# Patient Record
Sex: Male | Born: 1960 | Race: White | Hispanic: No | Marital: Married | Smoking: Current every day smoker
Health system: Southern US, Community
[De-identification: ages and names within clinical notes are randomized; demographics above are authoritative.]

## PROBLEM LIST (undated history)

## (undated) DIAGNOSIS — R519 Headache, unspecified: Secondary | ICD-10-CM

## (undated) DIAGNOSIS — J189 Pneumonia, unspecified organism: Secondary | ICD-10-CM

## (undated) DIAGNOSIS — I2510 Atherosclerotic heart disease of native coronary artery without angina pectoris: Secondary | ICD-10-CM

## (undated) DIAGNOSIS — M1990 Unspecified osteoarthritis, unspecified site: Secondary | ICD-10-CM

## (undated) DIAGNOSIS — R51 Headache: Secondary | ICD-10-CM

## (undated) DIAGNOSIS — I10 Essential (primary) hypertension: Secondary | ICD-10-CM

## (undated) DIAGNOSIS — E785 Hyperlipidemia, unspecified: Secondary | ICD-10-CM

## (undated) DIAGNOSIS — I5189 Other ill-defined heart diseases: Secondary | ICD-10-CM

## (undated) HISTORY — PX: TONSILLECTOMY: SUR1361

## (undated) HISTORY — PX: OTHER SURGICAL HISTORY: SHX169

## (undated) HISTORY — PX: REPLACEMENT TOTAL KNEE: SUR1224

## (undated) HISTORY — DX: Hyperlipidemia, unspecified: E78.5

## (undated) HISTORY — DX: Other ill-defined heart diseases: I51.89

## (undated) HISTORY — DX: Atherosclerotic heart disease of native coronary artery without angina pectoris: I25.10

---

## 2007-05-13 ENCOUNTER — Emergency Department: Payer: Self-pay | Admitting: Emergency Medicine

## 2009-04-22 ENCOUNTER — Ambulatory Visit (HOSPITAL_COMMUNITY): Admission: RE | Admit: 2009-04-22 | Discharge: 2009-04-22 | Payer: Self-pay | Admitting: Gastroenterology

## 2011-01-03 ENCOUNTER — Emergency Department: Payer: Self-pay | Admitting: *Deleted

## 2013-03-06 DIAGNOSIS — J189 Pneumonia, unspecified organism: Secondary | ICD-10-CM

## 2013-03-06 HISTORY — DX: Pneumonia, unspecified organism: J18.9

## 2015-08-26 ENCOUNTER — Other Ambulatory Visit: Payer: Self-pay | Admitting: Gastroenterology

## 2015-10-08 ENCOUNTER — Encounter (HOSPITAL_COMMUNITY): Payer: Self-pay | Admitting: *Deleted

## 2015-10-11 NOTE — Anesthesia Preprocedure Evaluation (Addendum)
Anesthesia Evaluation  Patient identified by MRN, date of birth, ID band Patient awake    Reviewed: Allergy & Precautions, NPO status , Patient's Chart, lab work & pertinent test results  History of Anesthesia Complications Negative for: history of anesthetic complications  Airway Mallampati: II  TM Distance: >3 FB Neck ROM: Full    Dental  (+) Teeth Intact, Dental Advisory Given   Pulmonary neg shortness of breath, neg sleep apnea, neg COPD, neg recent URI, Current Smoker,    Pulmonary exam normal breath sounds clear to auscultation       Cardiovascular Exercise Tolerance: Good hypertension, Pt. on medications (-) angina(-) Past MI, (-) Cardiac Stents and (-) CABG (-) dysrhythmias  Rhythm:Regular Rate:Normal     Neuro/Psych  Headaches (cluster headaches),    GI/Hepatic Neg liver ROS, Bowel prep,neg GERD  ,  Endo/Other  negative endocrine ROS  Renal/GU negative Renal ROS     Musculoskeletal  (+) Arthritis ,   Abdominal   Peds  Hematology negative hematology ROS (+)   Anesthesia Other Findings   Reproductive/Obstetrics                            Anesthesia Physical Anesthesia Plan  ASA: II  Anesthesia Plan: MAC   Post-op Pain Management:    Induction: Intravenous  Airway Management Planned: Natural Airway and Nasal Cannula  Additional Equipment:   Intra-op Plan:   Post-operative Plan:   Informed Consent: I have reviewed the patients History and Physical, chart, labs and discussed the procedure including the risks, benefits and alternatives for the proposed anesthesia with the patient or authorized representative who has indicated his/her understanding and acceptance.   Dental advisory given  Plan Discussed with: CRNA  Anesthesia Plan Comments:        Anesthesia Quick Evaluation

## 2015-10-12 ENCOUNTER — Ambulatory Visit (HOSPITAL_COMMUNITY)
Admission: RE | Admit: 2015-10-12 | Discharge: 2015-10-12 | Disposition: A | Discharge status: Home / Self Care | Payer: No Typology Code available for payment source | Source: Ambulatory Visit | Attending: Gastroenterology | Admitting: Gastroenterology

## 2015-10-12 ENCOUNTER — Ambulatory Visit (HOSPITAL_COMMUNITY): Payer: No Typology Code available for payment source | Admitting: Anesthesiology

## 2015-10-12 ENCOUNTER — Encounter (HOSPITAL_COMMUNITY)
Admission: RE | Disposition: A | Discharge status: Home / Self Care | Payer: Self-pay | Source: Ambulatory Visit | Attending: Gastroenterology

## 2015-10-12 ENCOUNTER — Encounter (HOSPITAL_COMMUNITY): Payer: Self-pay | Admitting: *Deleted

## 2015-10-12 DIAGNOSIS — D120 Benign neoplasm of cecum: Secondary | ICD-10-CM | POA: Insufficient documentation

## 2015-10-12 DIAGNOSIS — Z1211 Encounter for screening for malignant neoplasm of colon: Secondary | ICD-10-CM | POA: Diagnosis present

## 2015-10-12 DIAGNOSIS — I10 Essential (primary) hypertension: Secondary | ICD-10-CM | POA: Diagnosis not present

## 2015-10-12 DIAGNOSIS — F17200 Nicotine dependence, unspecified, uncomplicated: Secondary | ICD-10-CM | POA: Insufficient documentation

## 2015-10-12 DIAGNOSIS — Z791 Long term (current) use of non-steroidal anti-inflammatories (NSAID): Secondary | ICD-10-CM | POA: Diagnosis not present

## 2015-10-12 DIAGNOSIS — Z86010 Personal history of colonic polyps: Secondary | ICD-10-CM | POA: Insufficient documentation

## 2015-10-12 DIAGNOSIS — D122 Benign neoplasm of ascending colon: Secondary | ICD-10-CM | POA: Insufficient documentation

## 2015-10-12 DIAGNOSIS — K621 Rectal polyp: Secondary | ICD-10-CM | POA: Insufficient documentation

## 2015-10-12 DIAGNOSIS — Z79899 Other long term (current) drug therapy: Secondary | ICD-10-CM | POA: Diagnosis not present

## 2015-10-12 HISTORY — DX: Headache, unspecified: R51.9

## 2015-10-12 HISTORY — DX: Essential (primary) hypertension: I10

## 2015-10-12 HISTORY — DX: Pneumonia, unspecified organism: J18.9

## 2015-10-12 HISTORY — PX: COLONOSCOPY WITH PROPOFOL: SHX5780

## 2015-10-12 HISTORY — DX: Unspecified osteoarthritis, unspecified site: M19.90

## 2015-10-12 HISTORY — DX: Headache: R51

## 2015-10-12 SURGERY — COLONOSCOPY WITH PROPOFOL
Anesthesia: Monitor Anesthesia Care

## 2015-10-12 MED ORDER — ONDANSETRON HCL 4 MG/2ML IJ SOLN
INTRAMUSCULAR | Status: DC | PRN
  Administered 2015-10-12: 4 mg via INTRAVENOUS

## 2015-10-12 MED ORDER — PROPOFOL 500 MG/50ML IV EMUL
INTRAVENOUS | Status: DC | PRN
  Administered 2015-10-12: 100 ug/kg/min via INTRAVENOUS

## 2015-10-12 MED ORDER — LIDOCAINE HCL (CARDIAC) 20 MG/ML IV SOLN
INTRAVENOUS | Status: DC | PRN
  Administered 2015-10-12: 100 mg via INTRAVENOUS

## 2015-10-12 MED ORDER — LACTATED RINGERS IV SOLN
INTRAVENOUS | Status: DC
  Administered 2015-10-12: 1000 mL via INTRAVENOUS

## 2015-10-12 MED ORDER — SODIUM CHLORIDE 0.9 % IV SOLN: INTRAVENOUS | Status: DC

## 2015-10-12 MED ORDER — ONDANSETRON HCL 4 MG/2ML IJ SOLN
INTRAMUSCULAR | Status: AC
  Filled 2015-10-12: qty 2

## 2015-10-12 MED ORDER — PROPOFOL 10 MG/ML IV BOLUS
INTRAVENOUS | Status: AC
  Filled 2015-10-12: qty 60

## 2015-10-12 MED ORDER — LIDOCAINE HCL (CARDIAC) 20 MG/ML IV SOLN
INTRAVENOUS | Status: AC
  Filled 2015-10-12: qty 5

## 2015-10-12 MED ORDER — PROPOFOL 10 MG/ML IV BOLUS
INTRAVENOUS | Status: DC | PRN
  Administered 2015-10-12: 20 mg via INTRAVENOUS
  Administered 2015-10-12 (×2): 40 mg via INTRAVENOUS
  Administered 2015-10-12: 20 mg via INTRAVENOUS

## 2015-10-12 SURGICAL SUPPLY — 21 items

## 2015-10-12 NOTE — Anesthesia Postprocedure Evaluation (Signed)
Anesthesia Post Note  Patient: Gerald Garza  Procedure(s) Performed: Procedure(s) (LRB): COLONOSCOPY WITH PROPOFOL (N/A)  Patient location during evaluation: PACU Anesthesia Type: MAC Level of consciousness: awake and alert Pain management: pain level controlled Vital Signs Assessment: post-procedure vital signs reviewed and stable Respiratory status: spontaneous breathing, nonlabored ventilation and respiratory function stable Cardiovascular status: stable and blood pressure returned to baseline Anesthetic complications: no    Last Vitals:  Vitals:   10/12/15 0907 10/12/15 0920  BP:    Pulse: 82   Resp: 17 20  Temp:      Last Pain:  Vitals:   10/12/15 0907  TempSrc: Oral                 Nilda Simmer

## 2015-10-12 NOTE — Discharge Instructions (Signed)

## 2015-10-12 NOTE — H&P (Signed)
  Procedure: Surveillance colonoscopy. 04/22/2009 colonoscopy with removal of three adenomatous and hyperplastic colon polyps was performed.  History: The patient is a 55 year old male born October 23, 1960. He is scheduled to undergo a surveillance colonoscopy today.  Past medical history: Tonsillectomy. Foot surgery. Hypertension. Lumbosacral degenerative disc disease. Ankylosing spondylitis. Cluster headaches. Hypercholesterolemia.  Medication allergies: Codeine causes nausea and itching  Exam: The patient is lying comfortably on the endoscopy stretcher. Abdomen is soft and nontender to palpation. Cardiac exam reveals a regular rhythm. Lungs are clear to auscultation.  Plan: Proceed with surveillance colonoscopy

## 2015-10-12 NOTE — Op Note (Signed)
[facility] Patient Name: Gerald Garza Procedure Date: 10/12/2015 MRN: [id number] Attending MD: Garlan Fair , MD Date of Birth: 1960-10-15 CSN: [id number] Age: 55 Admit Type: Outpatient Procedure:                Colonoscopy Indications:              High risk colon cancer surveillance: Personal                            history of non-advanced adenoma Providers:                Garlan Fair, MD, Laverta Baltimore RN, RN,                            Despina Pole Tech, Technician, Adair Laundry, CRNA Referring MD:              Medicines:                Propofol per Anesthesia Complications:            No immediate complications. Estimated Blood Loss:     Estimated blood loss: none. Procedure:                Pre-Anesthesia Assessment:                           - Prior to the procedure, a History and Physical                            was performed, and patient medications and                            allergies were reviewed. The patient's tolerance of                            previous anesthesia was also reviewed. The risks                            and benefits of the procedure and the sedation                            options and risks were discussed with the patient.                            All questions were answered, and informed consent                            was obtained. Prior Anticoagulants: The patient has                            taken no previous anticoagulant or antiplatelet                            agents. ASA Grade Assessment: II - A patient with  mild systemic disease. After reviewing the risks                            and benefits, the patient was deemed in                            satisfactory condition to undergo the procedure.                           After obtaining informed consent, the colonoscope                            was passed under direct vision. Throughout the       procedure, the patient's blood pressure, pulse, and                            oxygen saturations were monitored continuously. The                            [id number]) scope was introduced through                            the anus and advanced to the the cecum, identified                            by appendiceal orifice and ileocecal valve. The                            colonoscopy was performed without difficulty. The                            patient tolerated the procedure well. The quality                            of the bowel preparation was good. The ileocecal                            valve, the appendiceal orifice and the rectum were                            photographed. Scope In: 8:30:09 AM Scope Out: 8:58:42 AM Scope Withdrawal Time: 0 hours 23 minutes 11 seconds  Total Procedure Duration: 0 hours 28 minutes 33 seconds  Findings:      The perianal and digital rectal examinations were normal.      Two sessile polyps were found in the rectum. The polyps were 5 mm in       size. These polyps were removed with a cold snare. Resection and       retrieval were complete.      A 5 mm polyp was found in the ascending colon. The polyp was sessile.       The polyp was removed with a cold snare. Resection and retrieval were       complete.      A 3 mm polyp was found in the cecum. The  polyp was sessile. The polyp       was removed with a cold biopsy forceps. Resection and retrieval were       complete.      The exam was otherwise without abnormality. Impression:               - Two 5 mm polyps in the rectum, removed with a                            cold snare. Resected and retrieved.                           - One 5 mm polyp in the ascending colon, removed                            with a cold snare. Resected and retrieved.                           - One 3 mm polyp in the cecum, removed with a cold                            biopsy forceps. Resected and  retrieved.                           - The examination was otherwise normal. Moderate Sedation:      N/A- Per Anesthesia Care Recommendation:           - Patient has a contact number available for                            emergencies. The signs and symptoms of potential                            delayed complications were discussed with the                            patient. Return to normal activities tomorrow.                            Written discharge instructions were provided to the                            patient.                           - Repeat colonoscopy date to be determined after                            pending pathology results are reviewed for                            surveillance.                           - Resume previous diet.                           -  Continue present medications. Procedure Code(s):        --- Professional ---                           [phone], Colonoscopy, flexible; with removal of                            tumor(s), polyp(s), or other lesion(s) by snare                            technique                           45380, 58, Colonoscopy, flexible; with biopsy,                            single or multiple Diagnosis Code(s):        --- Professional ---                           Z86.010, Personal history of colonic polyps                           K62.1, Rectal polyp                           D12.2, Benign neoplasm of ascending colon                           D12.0, Benign neoplasm of cecum CPT copyright 2016 [employer]. All rights reserved. The codes documented in this report are preliminary and upon coder review may  be revised to meet current compliance requirements. Earle Gell, MD Garlan Fair, MD 10/12/2015 9:09:10 AM This report has been signed electronically. Number of Addenda: 0

## 2015-10-12 NOTE — Transfer of Care (Signed)
Immediate Anesthesia Transfer of Care Note  Patient: Gerald Garza  Procedure(s) Performed: Procedure(s): COLONOSCOPY WITH PROPOFOL (N/A)  Patient Location: ENDO  Anesthesia Type:MAC  Level of Consciousness:  sedated, patient cooperative and responds to stimulation  Airway & Oxygen Therapy:Patient Spontanous Breathing and Patient connected to face mask oxgen  Post-op Assessment:  Report given to ENDO RN and Post -op Vital signs reviewed and stable  Post vital signs:  Reviewed and stable  Last Vitals:  Vitals:   10/12/15 0755  BP: (!) 143/91  Pulse: 73  Resp: 12  Temp: 123XX123 C    Complications: No apparent anesthesia complications

## 2015-10-13 ENCOUNTER — Encounter (HOSPITAL_COMMUNITY): Payer: Self-pay | Admitting: Gastroenterology

## 2016-10-04 ENCOUNTER — Other Ambulatory Visit: Payer: Self-pay | Admitting: Surgery

## 2019-09-29 ENCOUNTER — Ambulatory Visit (INDEPENDENT_AMBULATORY_CARE_PROVIDER_SITE_OTHER): Payer: No Typology Code available for payment source | Admitting: Podiatry

## 2019-09-29 ENCOUNTER — Other Ambulatory Visit: Payer: Self-pay | Admitting: Podiatry

## 2019-09-29 ENCOUNTER — Ambulatory Visit (INDEPENDENT_AMBULATORY_CARE_PROVIDER_SITE_OTHER): Payer: No Typology Code available for payment source

## 2019-09-29 ENCOUNTER — Other Ambulatory Visit: Payer: Self-pay

## 2019-09-29 DIAGNOSIS — M79671 Pain in right foot: Secondary | ICD-10-CM | POA: Diagnosis not present

## 2019-09-29 DIAGNOSIS — Q667 Congenital pes cavus, unspecified foot: Secondary | ICD-10-CM | POA: Diagnosis not present

## 2019-09-29 DIAGNOSIS — M19071 Primary osteoarthritis, right ankle and foot: Secondary | ICD-10-CM

## 2019-09-30 ENCOUNTER — Encounter: Payer: Self-pay | Admitting: Podiatry

## 2019-09-30 NOTE — Progress Notes (Signed)
Subjective:  Patient ID: Gerald Garza, male    DOB: 1960/10/27,  MRN: [id number]  Chief Complaint  Patient presents with  . Foot Pain    pt is here for right foot pain, possible bunion to the lateral side of the right foot. Pt states that the pain is elevated to the touch    59 y.o. male presents with the above complaint.  Patient presents with complaint of right first metatarsophalangeal joint.  Has been going for about a week.  When he called to make an appointment it was red hot swollen without any history of gout.  Patient has been worked up multiple times for gout but without any proper diagnosis.  He states is painful to touch.  His pain when ambulating.  His pain is much better now however it was worse last week.  Pain is worse at night and walking on it.  He works constantly on a Hospital doctor.  He denies any other acute complaints.  He would like to discuss treatment options.  He has not seen anyone else prior to seeing me.  Review of Systems: Negative except as noted in the HPI. Denies N/V/F/Ch.  Past Medical History:  Diagnosis Date  . Arthritis   . Headache    hx cluster headaches  . Hypertension   . Pneumonia 2015   bacterial    Current Outpatient Medications:  .  amLODipine (NORVASC) 5 MG tablet, amlodipine 5 mg tablet, Disp: , Rfl:  .  glucosamine-chondroitin 500-400 MG tablet, Take 1 tablet by mouth 2 (two) times daily., Disp: , Rfl:  .  hydrocortisone-pramoxine (PROCTOFOAM HC) rectal foam, Proctofoam HC 1 %-1 %  APPLY RECTALLY 4 TIMES A DAY AS NEEDED, Disp: , Rfl:  .  lisinopril-hydrochlorothiazide (PRINZIDE,ZESTORETIC) 20-25 MG tablet, Take 1 tablet by mouth daily., Disp: , Rfl:  .  Magnesium 250 MG TABS, Take by mouth 2 (two) times daily., Disp: , Rfl:  .  Multiple Vitamin (MULTI-VITAMIN DAILY PO), Take by mouth., Disp: , Rfl:  .  Naproxen Sodium (ALEVE PO), Take 1 tablet by mouth daily., Disp: , Rfl:  .  Omega-3 Fatty Acids (OMEGA-3 EPA FISH OIL PO), Take by  mouth. 3 per day, Disp: , Rfl:  .  vitamin B-12 (CYANOCOBALAMIN) 250 MCG tablet, Take 250 mcg by mouth daily., Disp: , Rfl:   Social History   Tobacco Use  Smoking Status Current Every Day Smoker  . Packs/day: 0.50  . Years: 40.00  . Pack years: 20.00  . Types: Cigarettes  Smokeless Tobacco Never Used    Allergies  Allergen Reactions  . Codeine Nausea Only    Itching also   Objective:  There were no vitals filed for this visit. There is no height or weight on file to calculate BMI. Constitutional Well developed. Well nourished.  Vascular Dorsalis pedis pulses palpable bilaterally. Posterior tibial pulses palpable bilaterally. Capillary refill normal to all digits.  No cyanosis or clubbing noted. Pedal hair growth normal.  Neurologic Normal speech. Oriented to person, place, and time. Epicritic sensation to light touch grossly present bilaterally.  Dermatologic Nails well groomed and normal in appearance. No open wounds. No skin lesions.  Orthopedic:  Limited motion noted at the right first metatarsophalangeal joint.  Intra-articular pain noted.  Crepitus palpated at the joint.  Hallux rigidus noted at the joint.  No flexor or extensor tendinitis noted.  Good range of motion noted at the IPJ without crepitus of the hallux   Radiographs: 3 views of skeletally  mature the right foot: Severe arthritis with loose bodies noted at the first metatarsophalangeal joint.  There is uneven joint space narrowing.  Arthritis noted of the right second metatarsophalangeal joint as well.  No other bony abnormalities identified. Assessment:   1. Foot pain, right   2. Pes cavus   3. Arthritis of first metatarsophalangeal (MTP) joint of right foot    Plan:  Patient was evaluated and treated and all questions answered.  Right first MPJ arthritis -I explained patient the etiology of arthritis and various treatment options were extensively discussed.  Given the amount of arthritis that is  present I discussed with the patient all the treatment options from conservative to surgical interventions were discussed.  For now patient will benefit from a steroid injection to help decrease the acute inflammatory component associated with pain.  I also believe patient will benefit from custom-made orthotics with Morton's extension.  He will be scheduled to see Liliane Channel for those. -I did discuss with the patient that eventually in the near future if the pain continues he will need a fusion of the right first metatarsophalangeal joint.  I will see him back again after his trip in September and will discuss further options including surgical options.  Patient states understanding -A steroid injection was performed at right first metatarsophalangeal joint using 1% plain Lidocaine and 10 mg of Kenalog. This was well tolerated.  Pes cavus -I explained patient the etiology of pes cavus foot structure and its relationship to the first metatarsophalangeal joint arthritic changes.  I believe patient will benefit from custom-made orthotics to help control the hindfoot motion as well as support the arch of the foot and utilize Morton's extension to take the stress off of the first metatarsophalangeal joint bilaterally. -You be scheduled to see Liliane Channel for custom-made orthotics with Morton's extension bilaterally   Return See [place] in second week of september, for See Liliane Channel for orthotics ASAP.

## 2019-10-22 ENCOUNTER — Ambulatory Visit: Payer: No Typology Code available for payment source | Admitting: Orthotics

## 2019-10-22 ENCOUNTER — Other Ambulatory Visit: Payer: Self-pay

## 2019-10-22 DIAGNOSIS — Q667 Congenital pes cavus, unspecified foot: Secondary | ICD-10-CM

## 2019-10-22 DIAGNOSIS — Q6671 Congenital pes cavus, right foot: Secondary | ICD-10-CM | POA: Diagnosis not present

## 2019-10-22 DIAGNOSIS — M79671 Pain in right foot: Secondary | ICD-10-CM

## 2019-10-22 DIAGNOSIS — Q6672 Congenital pes cavus, left foot: Secondary | ICD-10-CM

## 2019-10-22 DIAGNOSIS — M19071 Primary osteoarthritis, right ankle and foot: Secondary | ICD-10-CM

## 2019-10-22 NOTE — Progress Notes (Signed)
Cast for f/o hug arch to address pes cavus foot type, reverse morton and MORTON ext rigid right to enhance windlass mechanism and lock down motion on 1st MPJ

## 2019-10-22 NOTE — Addendum Note (Signed)
Addended by: Velora Heckler on: 10/22/2019 02:34 PM   Modules accepted: Level of Service

## 2019-12-24 ENCOUNTER — Other Ambulatory Visit: Payer: Self-pay | Admitting: Physician Assistant

## 2019-12-24 ENCOUNTER — Ambulatory Visit
Admission: RE | Admit: 2019-12-24 | Discharge: 2019-12-24 | Disposition: A | Discharge status: Home / Self Care | Payer: PRIVATE HEALTH INSURANCE | Source: Ambulatory Visit | Attending: Physician Assistant | Admitting: Physician Assistant

## 2019-12-24 DIAGNOSIS — Z01818 Encounter for other preprocedural examination: Secondary | ICD-10-CM

## 2020-04-19 ENCOUNTER — Other Ambulatory Visit: Payer: Self-pay | Admitting: Orthopedic Surgery

## 2020-04-19 DIAGNOSIS — M19012 Primary osteoarthritis, left shoulder: Secondary | ICD-10-CM

## 2020-04-24 ENCOUNTER — Other Ambulatory Visit: Payer: PRIVATE HEALTH INSURANCE

## 2020-05-08 ENCOUNTER — Other Ambulatory Visit: Payer: Self-pay

## 2020-05-08 ENCOUNTER — Ambulatory Visit
Admission: RE | Admit: 2020-05-08 | Discharge: 2020-05-08 | Disposition: A | Discharge status: Home / Self Care | Payer: PRIVATE HEALTH INSURANCE | Source: Ambulatory Visit | Attending: Orthopedic Surgery | Admitting: Orthopedic Surgery

## 2020-05-08 DIAGNOSIS — M19012 Primary osteoarthritis, left shoulder: Secondary | ICD-10-CM

## 2020-12-21 IMAGING — CR DG CHEST 2V
2 series · 2 of 2 positions shown · non-contrast
Comparison: None.

CLINICAL DATA: Preop for right knee replacement.

EXAM:
CHEST - 2 VIEW

[w chest pa]
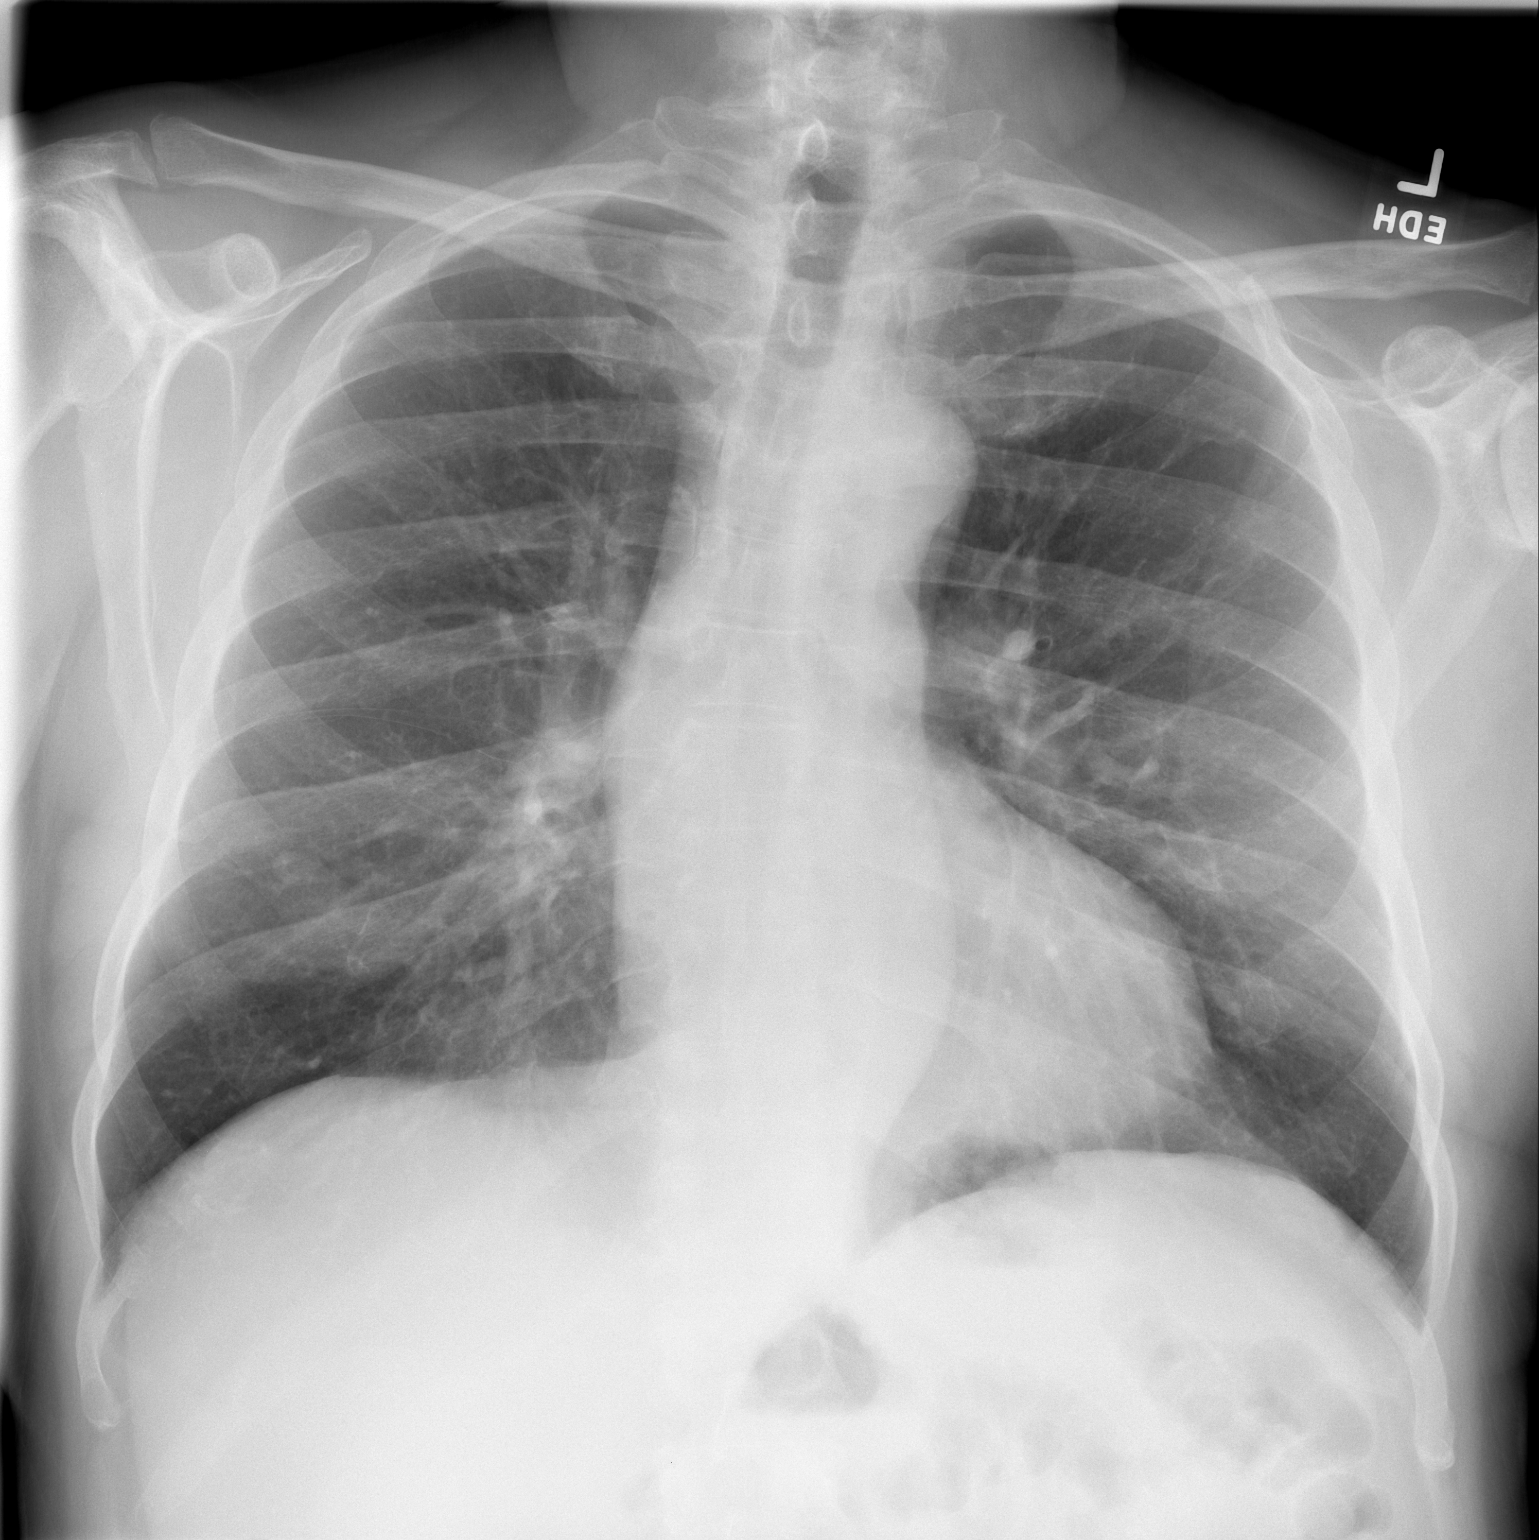

[w chest lat]
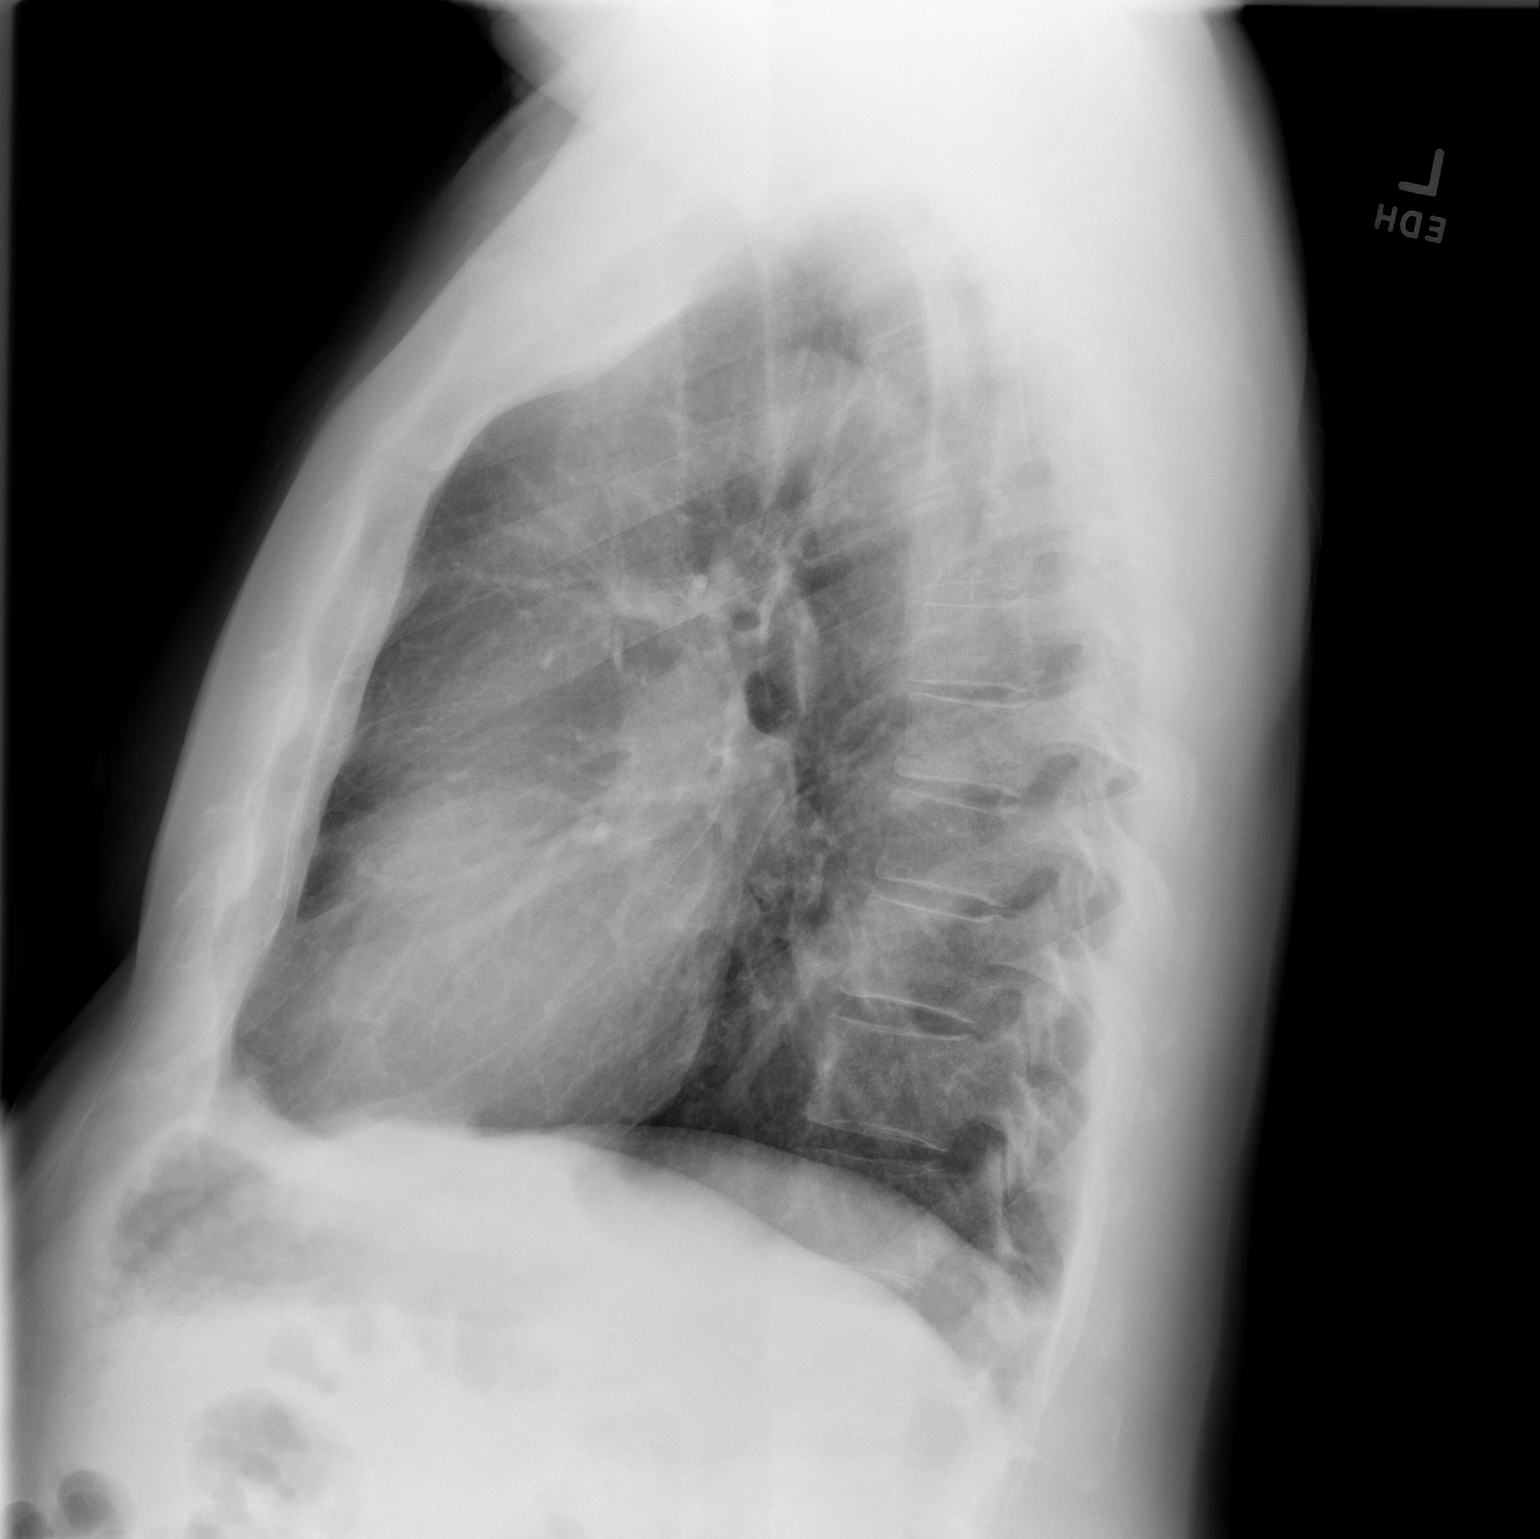

[2 of 2 positions shown; findings below may reference images not displayed]

FINDINGS: The heart size and mediastinal contours are within normal limits.
Both lungs are clear. The visualized skeletal structures are
unremarkable.
IMPRESSION: No active cardiopulmonary disease.

## 2021-05-06 IMAGING — CT CT SHOULDER*L* W/O CM
1 of 3 series · 7 of 14 positions shown, 9 images · non-contrast
Comparison: None.

CLINICAL DATA: Left shoulder pain. Limited range of motion.

EXAM:
CT OF THE UPPER LEFT EXTREMITY WITHOUT CONTRAST
TECHNIQUE: Multidetector CT imaging of the upper left extremity was performed
according to the standard protocol.

[Series 5: thin soft · axial · 0.60mm/px · z∈[-227,-83]mm · 7 of 321 slices shown, 9 images]
[im 41/321  soft-tissue]
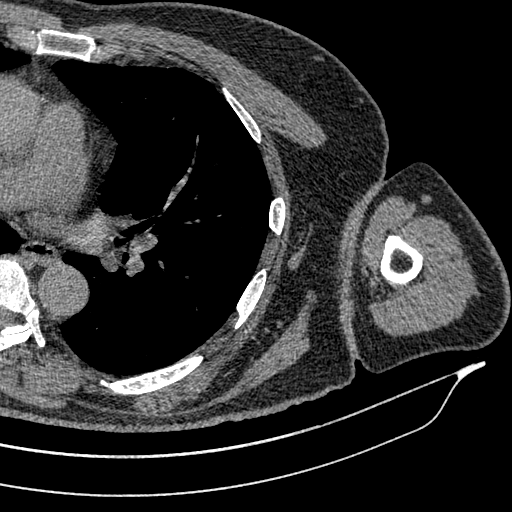
[im 41/321  bone]
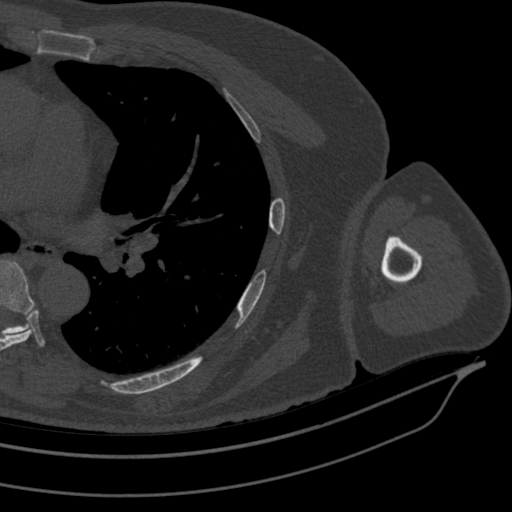
[im 81/321  bone]
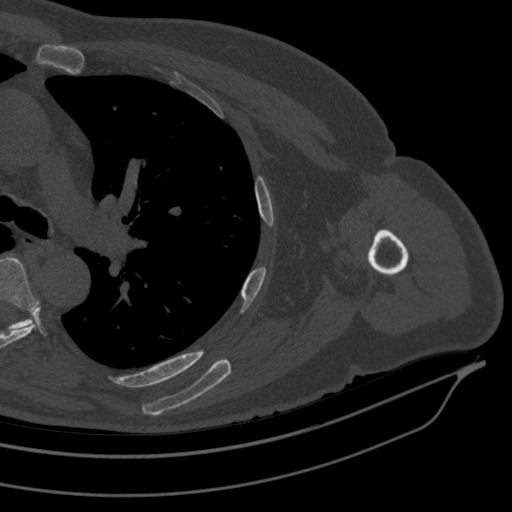
[im 121/321  bone]
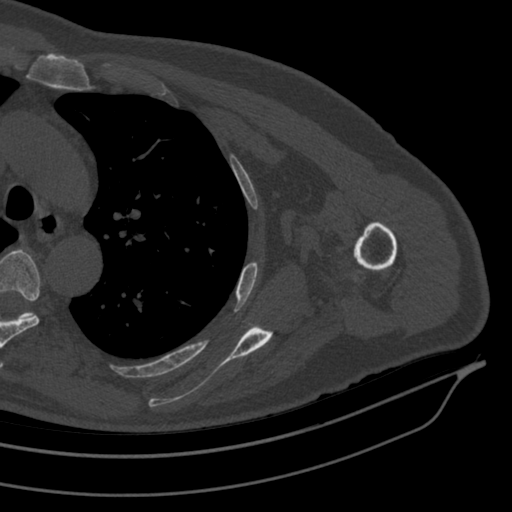
[im 161/321  bone]
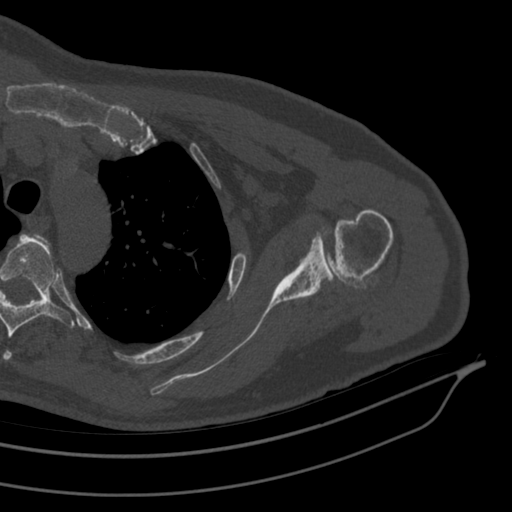
[im 201/321  soft-tissue]
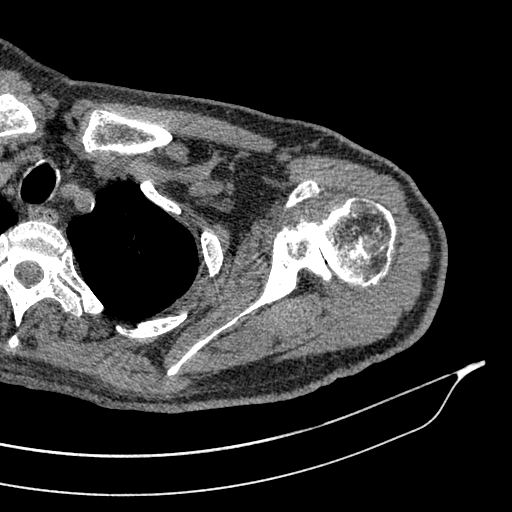
[im 201/321  bone]
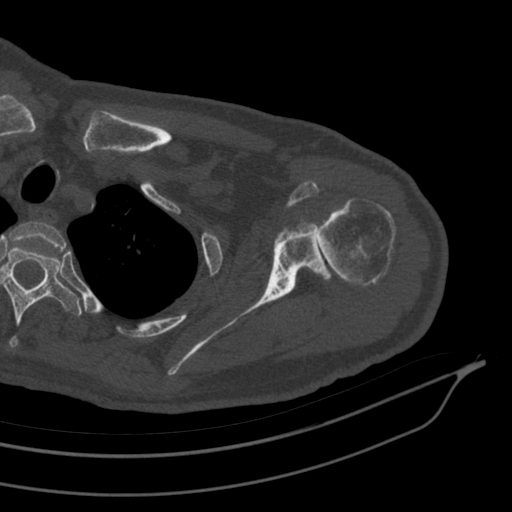
[im 241/321  bone]
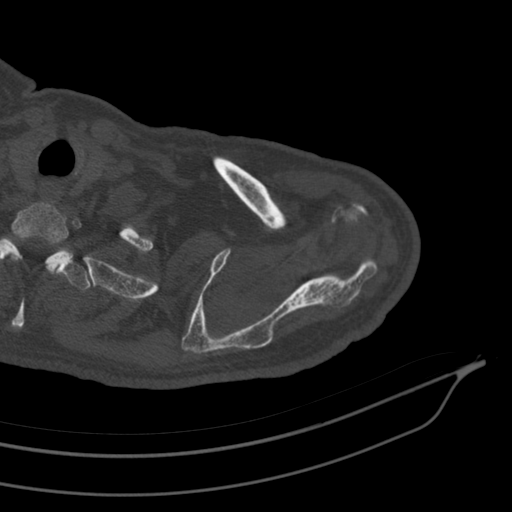
[im 281/321  bone]
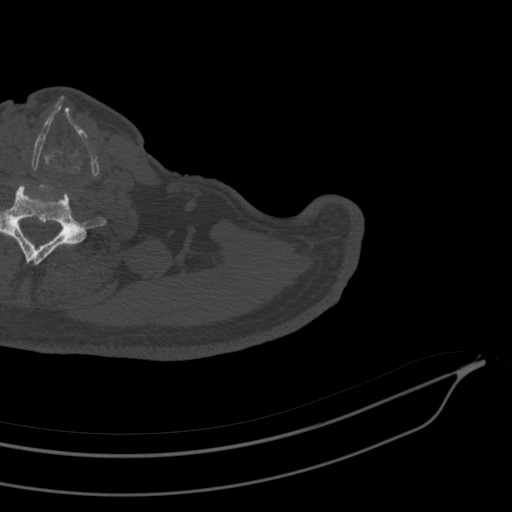

[7 of 14 positions shown; findings below may reference images not displayed]

FINDINGS: Bones/Joint/Cartilage

No fracture or dislocation. Normal alignment. No joint effusion.

Severe osteoarthritis of the glenohumeral joint with severe joint
space narrowing, a bone-on-bone appearance, subchondral sclerosis,
subchondral cystic changes and marginal osteophytosis. Bulky osseous
protuberance along the medial aspect of the humeral neck which may
reflect a osteo chondroma or heterotopic ossification. Loose body in
the axillary recess measuring 12 mm.

Moderate arthropathy of the acromioclavicular joint. Type I
acromion.

Ligaments

Ligaments are suboptimally evaluated by CT.

Muscles and Tendons
Muscles are normal.  No muscle atrophy.

Soft tissue
No fluid collection or hematoma. No soft tissue mass. Visualized
portions of the lung are clear.
IMPRESSION: 1. Severe osteoarthritis of the glenohumeral joint.
2. Bulky osseous protuberance along the medial aspect of the humeral
neck which may reflect a osteo chondroma or heterotopic
ossification.

## 2022-08-26 ENCOUNTER — Ambulatory Visit
Admission: EM | Admit: 2022-08-26 | Discharge: 2022-08-26 | Disposition: A | Discharge status: Home / Self Care | Payer: No Typology Code available for payment source | Attending: Urgent Care | Admitting: Urgent Care

## 2022-08-26 DIAGNOSIS — J189 Pneumonia, unspecified organism: Secondary | ICD-10-CM

## 2022-08-26 DIAGNOSIS — R0602 Shortness of breath: Secondary | ICD-10-CM

## 2022-08-26 DIAGNOSIS — R062 Wheezing: Secondary | ICD-10-CM | POA: Diagnosis not present

## 2022-08-26 MED ORDER — IPRATROPIUM BROMIDE 0.02 % IN SOLN
0.5000 mg | Freq: Once | RESPIRATORY_TRACT | Status: AC
  Administered 2022-08-26: 0.5 mg via RESPIRATORY_TRACT

## 2022-08-26 MED ORDER — AMOXICILLIN-POT CLAVULANATE 875-125 MG PO TABS: 1.0000 | ORAL_TABLET | Freq: Two times a day (BID) | ORAL | 0 refills | Status: AC

## 2022-08-26 MED ORDER — ALBUTEROL SULFATE HFA 108 (90 BASE) MCG/ACT IN AERS: 1.0000 | INHALATION_SPRAY | Freq: Four times a day (QID) | RESPIRATORY_TRACT | 0 refills | Status: DC | PRN

## 2022-08-26 MED ORDER — ALBUTEROL SULFATE (2.5 MG/3ML) 0.083% IN NEBU
2.5000 mg | INHALATION_SOLUTION | Freq: Once | RESPIRATORY_TRACT | Status: AC
  Administered 2022-08-26: 2.5 mg via RESPIRATORY_TRACT

## 2022-08-26 MED ORDER — PREDNISONE 10 MG (21) PO TBPK: ORAL_TABLET | Freq: Every day | ORAL | 0 refills | Status: DC

## 2022-08-26 NOTE — ED Triage Notes (Signed)
Pt c/o fatigue, chest tightness, cough, congestion, body aches, mild fever, and chills.   Started: Thursday   Home interventions: tylenol, amoxicillin (last night), albuterol inhaler (1 puff)

## 2022-08-26 NOTE — Discharge Instructions (Addendum)
Follow up here or with your primary care provider if your symptoms are worsening or not improving with treatment.     

## 2022-08-26 NOTE — ED Provider Notes (Signed)
[facility] Gerald Garza    CSN: [id number] Arrival date & time: 08/26/22  8295      History   Chief Complaint Chief Complaint  Patient presents with   Chills   Fever   Cough   Generalized Body Aches    HPI Gerald Garza is a 62 y.o. male.    Fever Associated symptoms: cough   Cough Associated symptoms: fever     Presents to urgent care with symptoms x 2 days.  He endorses fatigue, chest tightness, cough, chest congestion, body aches, mild "fever", chills.  Patient has been using Tylenol, amoxicillin, and albuterol inhaler to control his symptoms.  He states he took 2 doses of amoxicillin that he had for prophylactic treatment prior to dental work.  Also used his wife's albuterol inhaler.  Denies any improvement in symptoms after albuterol.  He states he was thinking about his breathing all night long and feeling as if he is not getting enough air in.  Patient is accompanied by his wife who states that they had planned to go to the beach today.  His wife endorses similar symptoms that was treated with a course of prednisone and albuterol.  She states she had improved symptoms after a day or so.  Past Medical History:  Diagnosis Date   Arthritis    Headache    hx cluster headaches   Hypertension    Pneumonia 2015   bacterial    There are no problems to display for this patient.   Past Surgical History:  Procedure Laterality Date   bunion surgery Left    right hammer to done also   COLONOSCOPY WITH PROPOFOL N/A 10/12/2015   Procedure: COLONOSCOPY WITH PROPOFOL;  Surgeon: Gerald Bumpers, MD;  Location: [facility];  Service: Endoscopy;  Laterality: N/A;   REPLACEMENT TOTAL KNEE Bilateral    TONSILLECTOMY  age 58       Home Medications    Prior to Admission medications   Medication Sig Start Date End Date Taking? Authorizing Provider  amLODipine (NORVASC) 5 MG tablet amlodipine 5 mg tablet    [provider]  glucosamine-chondroitin 500-400  MG tablet Take 1 tablet by mouth 2 (two) times daily.    [provider]  hydrocortisone-pramoxine (PROCTOFOAM HC) rectal foam Proctofoam HC 1 %-1 %  APPLY RECTALLY 4 TIMES A DAY AS NEEDED    [provider]  lisinopril-hydrochlorothiazide (PRINZIDE,ZESTORETIC) 20-25 MG tablet Take 1 tablet by mouth daily.    [provider]  Magnesium 250 MG TABS Take by mouth 2 (two) times daily.    [provider]  Multiple Vitamin (MULTI-VITAMIN DAILY PO) Take by mouth.    [provider]  Naproxen Sodium (ALEVE PO) Take 1 tablet by mouth daily.    [provider]  Omega-3 Fatty Acids (OMEGA-3 EPA FISH OIL PO) Take by mouth. 3 per day    [provider]  vitamin B-12 (CYANOCOBALAMIN) 250 MCG tablet Take 250 mcg by mouth daily.    [provider]    Family History History reviewed. No pertinent family history.  Social History Social History   Tobacco Use   Smoking status: Every Day    Packs/day: 0.50    Years: 40.00    Additional pack years: 0.00    Total pack years: 20.00    Types: Cigarettes   Smokeless tobacco: Never  Substance Use Topics   Alcohol use: Yes    Comment: beer weekends   Drug use: No  Allergies   Codeine   Review of Systems Review of Systems  Constitutional:  Positive for fever.  Respiratory:  Positive for cough.      Physical Exam Triage Vital Signs ED Triage Vitals  Enc Vitals Group     BP 08/26/22 0834 136/89     Pulse Rate 08/26/22 0834 89     Resp 08/26/22 0834 18     Temp 08/26/22 0834 99.7 F (37.6 C)     Temp Source 08/26/22 0834 Oral     SpO2 08/26/22 0834 94 %     Weight --      Height --      Head Circumference --      Peak Flow --      Pain Score 08/26/22 0830 4     Pain Loc --      Pain Edu? --      Excl. in GC? --    No data found.  Updated Vital Signs BP 136/89 (BP Location: Left Arm)   Pulse 89   Temp 99.7 F (37.6 C) (Oral)   Resp 18   SpO2 94%    Visual Acuity Right Eye Distance:   Left Eye Distance:   Bilateral Distance:    Right Eye Near:   Left Eye Near:    Bilateral Near:     Physical Exam Vitals reviewed.  Constitutional:      Appearance: Normal appearance. He is ill-appearing.  Cardiovascular:     Rate and Rhythm: Normal rate and regular rhythm.     Pulses: Normal pulses.     Heart sounds: Normal heart sounds.  Pulmonary:     Effort: Pulmonary effort is normal.     Breath sounds: Examination of the right-upper field reveals rhonchi. Examination of the right-middle field reveals rhonchi. Examination of the right-lower field reveals rhonchi. Wheezing and rhonchi present.  Skin:    General: Skin is warm.  Neurological:     General: No focal deficit present.     Mental Status: He is alert and oriented to person, place, and time.  Psychiatric:        Mood and Affect: Mood normal.        Behavior: Behavior normal.      UC Treatments / Results  Labs (all labs ordered are listed, but only abnormal results are displayed) Labs Reviewed - No data to display  EKG   Radiology No results found.  Procedures Procedures (including critical care time)  Medications Ordered in UC Medications - No data to display  Initial Impression / Assessment and Plan / UC Course  I have reviewed the triage vital signs and the nursing notes.  Pertinent labs & imaging results that were available during my care of the patient were reviewed by me and considered in my medical decision making (see chart for details).   Gerald Garza is a 62 y.o. male presenting with lower respiratory infection. Patient temp 99.7 F with recent acetaminophen, satting 94% on room air. Overall is ill appearing though non-toxic, well hydrated, without respiratory distress. Pulmonary exam is remarkable for wheezing, rhonchi in the R side lobes along with expiratory "squeak". RRR without murmurs, rubs, gallops.  Reviewed relevant chart history.  Short  duration of symptoms makes viral etiology most likely however I am concerned about the adventitious breath sounds on his left side as well as his relatively low oxygen saturation on room air.  Giving ipratropium-albuterol via nebulizer in clinic to assess response.  Concern for possible  CAP and will prescribe Augmentin as well as a course of prednisone to relieve inflammation.  Counseled patient on potential for adverse effects with medications prescribed/recommended today, ER and return-to-clinic precautions discussed, patient verbalized understanding and agreement with care plan.  Final Clinical Impressions(s) / UC Diagnoses   Final diagnoses:  None   Discharge Instructions   None    ED Prescriptions   None    PDMP not reviewed this encounter.   Charma Igo, [place] 08/26/22 [phone]

## 2023-05-23 ENCOUNTER — Emergency Department

## 2023-05-23 ENCOUNTER — Observation Stay (HOSPITAL_COMMUNITY)

## 2023-05-23 ENCOUNTER — Inpatient Hospital Stay
Admission: EM | Admit: 2023-05-23 | Discharge: 2023-05-25 | DRG: 322 | Disposition: A | Discharge status: Home / Self Care | Attending: Internal Medicine | Admitting: Internal Medicine

## 2023-05-23 ENCOUNTER — Other Ambulatory Visit: Payer: Self-pay

## 2023-05-23 DIAGNOSIS — I2089 Other forms of angina pectoris: Secondary | ICD-10-CM | POA: Diagnosis not present

## 2023-05-23 DIAGNOSIS — Z96612 Presence of left artificial shoulder joint: Secondary | ICD-10-CM | POA: Diagnosis not present

## 2023-05-23 DIAGNOSIS — Q219 Congenital malformation of cardiac septum, unspecified: Secondary | ICD-10-CM | POA: Diagnosis not present

## 2023-05-23 DIAGNOSIS — R079 Chest pain, unspecified: Secondary | ICD-10-CM

## 2023-05-23 DIAGNOSIS — F17210 Nicotine dependence, cigarettes, uncomplicated: Secondary | ICD-10-CM | POA: Diagnosis not present

## 2023-05-23 DIAGNOSIS — E782 Mixed hyperlipidemia: Secondary | ICD-10-CM

## 2023-05-23 DIAGNOSIS — Z96653 Presence of artificial knee joint, bilateral: Secondary | ICD-10-CM | POA: Diagnosis present

## 2023-05-23 DIAGNOSIS — J449 Chronic obstructive pulmonary disease, unspecified: Secondary | ICD-10-CM | POA: Diagnosis present

## 2023-05-23 DIAGNOSIS — I2510 Atherosclerotic heart disease of native coronary artery without angina pectoris: Secondary | ICD-10-CM | POA: Diagnosis not present

## 2023-05-23 DIAGNOSIS — Z79899 Other long term (current) drug therapy: Secondary | ICD-10-CM

## 2023-05-23 DIAGNOSIS — Z8249 Family history of ischemic heart disease and other diseases of the circulatory system: Secondary | ICD-10-CM | POA: Diagnosis not present

## 2023-05-23 DIAGNOSIS — F17200 Nicotine dependence, unspecified, uncomplicated: Secondary | ICD-10-CM

## 2023-05-23 DIAGNOSIS — R9439 Abnormal result of other cardiovascular function study: Secondary | ICD-10-CM

## 2023-05-23 DIAGNOSIS — I10 Essential (primary) hypertension: Secondary | ICD-10-CM

## 2023-05-23 DIAGNOSIS — R7989 Other specified abnormal findings of blood chemistry: Secondary | ICD-10-CM

## 2023-05-23 DIAGNOSIS — I25118 Atherosclerotic heart disease of native coronary artery with other forms of angina pectoris: Secondary | ICD-10-CM | POA: Diagnosis not present

## 2023-05-23 DIAGNOSIS — Z885 Allergy status to narcotic agent status: Secondary | ICD-10-CM | POA: Diagnosis not present

## 2023-05-23 DIAGNOSIS — I200 Unstable angina: Secondary | ICD-10-CM | POA: Diagnosis not present

## 2023-05-23 DIAGNOSIS — I214 Non-ST elevation (NSTEMI) myocardial infarction: Secondary | ICD-10-CM | POA: Diagnosis not present

## 2023-05-23 DIAGNOSIS — R0789 Other chest pain: Secondary | ICD-10-CM | POA: Diagnosis not present

## 2023-05-23 LAB — NM MYOCAR MULTI W/SPECT W/WALL MOTION / EF
LV dias vol: 93 mL (ref 62–150)
LV sys vol: 31 mL
Nuc Stress EF: 67 %
Peak HR: 93 {beats}/min
Rest HR: 75 {beats}/min
Rest Nuclear Isotope Dose: 10.2 mCi
SDS: 2
SRS: 22
SSS: 13
ST Depression (mm): 0 mm
Stress Nuclear Isotope Dose: 32.7 mCi
TID: 1.02

## 2023-05-23 LAB — BASIC METABOLIC PANEL
Anion gap: 10 (ref 5–15)
BUN: 16 mg/dL (ref 8–23)
CO2: 22 mmol/L (ref 22–32)
Calcium: 9 mg/dL (ref 8.9–10.3)
Chloride: 107 mmol/L (ref 98–111)
Creatinine, Ser: 0.79 mg/dL (ref 0.61–1.24)
GFR, Estimated: >60 mL/min (ref >60)
Glucose, Bld: 133 mg/dL — ABNORMAL HIGH (ref 70–99)
Potassium: 3.6 mmol/L (ref 3.5–5.1)
Sodium: 139 mmol/L (ref 135–145)

## 2023-05-23 LAB — TROPONIN I (HIGH SENSITIVITY)
Troponin I (High Sensitivity): 38 ng/L — ABNORMAL HIGH (ref <18)
Troponin I (High Sensitivity): 41 ng/L — ABNORMAL HIGH (ref <18)

## 2023-05-23 LAB — LIPID PANEL
Cholesterol: 200 mg/dL (ref 0–200)
HDL: 41 mg/dL (ref >40)
LDL Cholesterol: 85 mg/dL (ref 0–99)
Total CHOL/HDL Ratio: 4.9 ratio
Triglycerides: 368 mg/dL — ABNORMAL HIGH (ref <150)
VLDL: 74 mg/dL — ABNORMAL HIGH (ref 0–40)

## 2023-05-23 LAB — HEPARIN LEVEL (UNFRACTIONATED)
Heparin Unfractionated: 0.27 [IU]/mL — ABNORMAL LOW (ref 0.30–0.70)
Heparin Unfractionated: 0.27 [IU]/mL — ABNORMAL LOW (ref 0.30–0.70)

## 2023-05-23 LAB — CBC
HCT: 48.6 % (ref 39.0–52.0)
Hemoglobin: 17.1 g/dL — ABNORMAL HIGH (ref 13.0–17.0)
MCH: 33.1 pg (ref 26.0–34.0)
MCHC: 35.2 g/dL (ref 30.0–36.0)
MCV: 94.2 fL (ref 80.0–100.0)
Platelets: 243 10*3/uL (ref 150–400)
RBC: 5.16 MIL/uL (ref 4.22–5.81)
RDW: 13.7 % (ref 11.5–15.5)
WBC: 9.5 10*3/uL (ref 4.0–10.5)
nRBC: 0 % (ref 0.0–0.2)

## 2023-05-23 LAB — APTT: aPTT: 31 s (ref 24–36)

## 2023-05-23 LAB — PROTIME-INR
INR: 1.1 (ref 0.8–1.2)
Prothrombin Time: 14 s (ref 11.4–15.2)

## 2023-05-23 LAB — HIV ANTIBODY (ROUTINE TESTING W REFLEX): HIV Screen 4th Generation wRfx: NONREACTIVE

## 2023-05-23 LAB — TSH: TSH: 1.022 u[IU]/mL (ref 0.350–4.500)

## 2023-05-23 MED ORDER — HYDROMORPHONE HCL 1 MG/ML IJ SOLN: 0.5000 mg | INTRAMUSCULAR | Status: AC | PRN

## 2023-05-23 MED ORDER — HEPARIN BOLUS VIA INFUSION
4000.0000 [IU] | Freq: Once | INTRAVENOUS | Status: AC
  Administered 2023-05-23: 4000 [IU] via INTRAVENOUS
  Filled 2023-05-23: qty 4000

## 2023-05-23 MED ORDER — ASPIRIN 81 MG PO TBEC
81.0000 mg | DELAYED_RELEASE_TABLET | Freq: Every day | ORAL | Status: DC
  Administered 2023-05-23 – 2023-05-25 (×3): 81 mg via ORAL
  Filled 2023-05-23 (×3): qty 1

## 2023-05-23 MED ORDER — LISINOPRIL 20 MG PO TABS
20.0000 mg | ORAL_TABLET | Freq: Every day | ORAL | Status: DC
  Administered 2023-05-23 – 2023-05-25 (×3): 20 mg via ORAL
  Filled 2023-05-23: qty 1
  Filled 2023-05-23 (×2): qty 2

## 2023-05-23 MED ORDER — ALBUTEROL SULFATE (2.5 MG/3ML) 0.083% IN NEBU: 2.5000 mg | INHALATION_SOLUTION | RESPIRATORY_TRACT | Status: AC | PRN

## 2023-05-23 MED ORDER — TECHNETIUM TC 99M TETROFOSMIN IV KIT
10.2100 | PACK | Freq: Once | INTRAVENOUS | Status: AC | PRN
  Administered 2023-05-23: 10.21 via INTRAVENOUS

## 2023-05-23 MED ORDER — HEPARIN (PORCINE) 25000 UT/250ML-% IV SOLN
1700.0000 [IU]/h | INTRAVENOUS | Status: DC
  Administered 2023-05-23: 1500 [IU]/h via INTRAVENOUS
  Administered 2023-05-23: 1300 [IU]/h via INTRAVENOUS
  Filled 2023-05-23 (×3): qty 250

## 2023-05-23 MED ORDER — ONDANSETRON HCL 4 MG/2ML IJ SOLN: 4.0000 mg | Freq: Three times a day (TID) | INTRAMUSCULAR | Status: AC | PRN

## 2023-05-23 MED ORDER — HEPARIN BOLUS VIA INFUSION
1500.0000 [IU] | Freq: Once | INTRAVENOUS | Status: AC
  Administered 2023-05-23: 1500 [IU] via INTRAVENOUS
  Filled 2023-05-23: qty 1500

## 2023-05-23 MED ORDER — TECHNETIUM TC 99M TETROFOSMIN IV KIT
32.6800 | PACK | Freq: Once | INTRAVENOUS | Status: AC | PRN
  Administered 2023-05-23: 32.68 via INTRAVENOUS

## 2023-05-23 MED ORDER — AMLODIPINE BESYLATE 5 MG PO TABS
5.0000 mg | ORAL_TABLET | Freq: Every day | ORAL | Status: DC
  Administered 2023-05-23 – 2023-05-25 (×3): 5 mg via ORAL
  Filled 2023-05-23 (×3): qty 1

## 2023-05-23 MED ORDER — HYDROCHLOROTHIAZIDE 25 MG PO TABS
25.0000 mg | ORAL_TABLET | Freq: Every day | ORAL | Status: DC
  Administered 2023-05-23 – 2023-05-25 (×3): 25 mg via ORAL
  Filled 2023-05-23 (×3): qty 1

## 2023-05-23 MED ORDER — LISINOPRIL-HYDROCHLOROTHIAZIDE 20-25 MG PO TABS
1.0000 | ORAL_TABLET | Freq: Every day | ORAL | Status: DC
Start: 2023-05-23 — End: 2023-05-23

## 2023-05-23 MED ORDER — ASPIRIN 81 MG PO CHEW
324.0000 mg | CHEWABLE_TABLET | Freq: Once | ORAL | Status: AC
  Administered 2023-05-23: 324 mg via ORAL
  Filled 2023-05-23: qty 4

## 2023-05-23 MED ORDER — ACETAMINOPHEN 325 MG PO TABS: 650.0000 mg | ORAL_TABLET | ORAL | Status: DC | PRN

## 2023-05-23 MED ORDER — GLUCOSAMINE-CHONDROITIN 500-400 MG PO TABS: 1.0000 | ORAL_TABLET | Freq: Two times a day (BID) | ORAL | Status: DC

## 2023-05-23 MED ORDER — REGADENOSON 0.4 MG/5ML IV SOLN
0.4000 mg | Freq: Once | INTRAVENOUS | Status: AC
  Administered 2023-05-23: 0.4 mg via INTRAVENOUS

## 2023-05-23 NOTE — ED Notes (Signed)
 Blue and red top sent to lab along with urine.

## 2023-05-23 NOTE — Progress Notes (Signed)
 ANTICOAGULATION CONSULT NOTE  Pharmacy Consult for heparin infusion Indication: ACS/STEMI  Allergies  Allergen Reactions   Codeine Nausea Only    Itching also   Hydrocodone     Other Reaction(s): Not available  hydrocodone    Patient Measurements: Height: 6' (182.9 cm) Weight: 104.3 kg (230 lb) IBW/kg (Calculated) : 77.6 Heparin Dosing Weight: 99.2 kg  Vital Signs: Temp: 97.8 F (36.6 C) (03/19 1230) Temp Source: Oral (03/19 1230) BP: 121/83 (03/19 1230) Pulse Rate: 75 (03/19 1230)  Labs: Recent Labs    05/23/23 0154 05/23/23 0440 05/23/23 0511 05/23/23 1251  HGB 17.1*  --   --   --   HCT 48.6  --   --   --   PLT 243  --   --   --   APTT  --   --  31  --   LABPROT  --   --  14.0  --   INR  --   --  1.1  --   HEPARINUNFRC  --   --   --  0.27*  CREATININE 0.79  --   --   --   TROPONINIHS 38* 41*  --   --     Estimated Creatinine Clearance: 115 mL/min (by C-G formula based on SCr of 0.79 mg/dL).   Medical History: Past Medical History:  Diagnosis Date   Arthritis    Headache    hx cluster headaches   Hypertension    Pneumonia 2015   bacterial    Assessment: Pt is a 63 yo male presenting to ED "c/o tightness in center of chest. Pt reports it started last night around 10pm." Pt also reporting SOB & dizziness.  Initial troponin I level found to be slightly elevated.  Date/Time HL Rate  Comment 3/19 1251 0.27 1300 units/hr Subtherapeutic   Goal of Therapy:  Heparin level 0.3-0.7 units/ml Monitor platelets by anticoagulation protocol: Yes   Plan:  Heparin level subtherapeutic  Bolus 1500 units x 1 Increase heparin infusion rate to 1500 units/hr Will check HL in 6 hr after rate change CBC daily while on heparin  Paulita Fujita, PharmD Clinical Pharmacist  05/23/2023 2:06 PM

## 2023-05-23 NOTE — ED Provider Notes (Signed)
 [facility] Provider Note    Event Date/Time   First MD Initiated Contact with Patient 05/23/23 [phone]     (approximate)   History   Chest Pain   HPI  Gerald Garza is a 63 y.o. male with history of hypertension, tobacco use who presents to the emergency department with complaints of chest pain that started Monday night on 05/21/2023.  Reports he had been working in the yard all day and felt fine.  At rest he started having chest pressure with shortness of breath.  No nausea, vomiting, diaphoresis, dizziness, fever.  Has had mild nonproductive cough.  No history of PE, DVT, exogenous estrogen use, recent fractures, surgery, trauma, hospitalization, prolonged travel or other immobilization. No lower extremity swelling or pain. No calf tenderness.  No prior history of stress test or cardiac catheterization.  No radiation of pain.  Is not having any chest discomfort or chest pain at all at this time.   History provided by patient, wife.    Past Medical History:  Diagnosis Date   Arthritis    Headache    hx cluster headaches   Hypertension    Pneumonia 2015   bacterial    Past Surgical History:  Procedure Laterality Date   bunion surgery Left    right hammer to done also   COLONOSCOPY WITH PROPOFOL N/A 10/12/2015   Procedure: COLONOSCOPY WITH PROPOFOL;  Surgeon: Charolett Bumpers, MD;  Location: [facility];  Service: Endoscopy;  Laterality: N/A;   REPLACEMENT TOTAL KNEE Bilateral    TONSILLECTOMY  age 69    MEDICATIONS:  Prior to Admission medications   Medication Sig Start Date End Date Taking? Authorizing Provider  albuterol (VENTOLIN HFA) 108 (90 Base) MCG/ACT inhaler Inhale 1-2 puffs into the lungs every 6 (six) hours as needed for wheezing or shortness of breath. 08/26/22   Immordino, Jeannett Senior, FNP  amLODipine (NORVASC) 5 MG tablet amlodipine 5 mg tablet    [provider]  glucosamine-chondroitin 500-400 MG tablet Take 1 tablet by  mouth 2 (two) times daily.    [provider]  hydrocortisone-pramoxine (PROCTOFOAM HC) rectal foam Proctofoam HC 1 %-1 %  APPLY RECTALLY 4 TIMES A DAY AS NEEDED    [provider]  lisinopril-hydrochlorothiazide (PRINZIDE,ZESTORETIC) 20-25 MG tablet Take 1 tablet by mouth daily.    [provider]  Magnesium 250 MG TABS Take by mouth 2 (two) times daily.    [provider]  Multiple Vitamin (MULTI-VITAMIN DAILY PO) Take by mouth.    [provider]  Naproxen Sodium (ALEVE PO) Take 1 tablet by mouth daily.    [provider]  Omega-3 Fatty Acids (OMEGA-3 EPA FISH OIL PO) Take by mouth. 3 per day    [provider]  predniSONE (STERAPRED UNI-PAK 21 TAB) 10 MG (21) TBPK tablet Take by mouth daily. Take 6 tabs by mouth daily for 1 day, then 5 tabs for 1 day, then 4 tabs for 1 day, then 3 tabs for 1 day, then 2 tabs for 1 day, then 1 tab by mouth daily for 1 day 08/26/22   Immordino, Stephen, FNP  vitamin B-12 (CYANOCOBALAMIN) 250 MCG tablet Take 250 mcg by mouth daily.    [provider]    Physical Exam   Triage Vital Signs: ED Triage Vitals  Encounter Vitals Group     BP 05/23/23 0153 (!) 151/86     Systolic BP Percentile --      Diastolic BP Percentile --  Pulse Rate 05/23/23 0153 83     Resp 05/23/23 0153 (!) 95     Temp 05/23/23 0153 98 F (36.7 C)     Temp Source 05/23/23 0153 Oral     SpO2 05/23/23 0153 95 %     Weight 05/23/23 0150 230 lb (104.3 kg)     Height 05/23/23 0150 6' (1.829 m)     Head Circumference --      Peak Flow --      Pain Score 05/23/23 0150 4     Pain Loc --      Pain Education --      Exclude from Growth Chart --     Most recent vital signs: Vitals:   05/23/23 0445 05/23/23 0554  BP: (!) 140/92   Pulse: 80   Resp: 20   Temp:  98.3 F (36.8 C)  SpO2: 97%     CONSTITUTIONAL: Alert, responds appropriately to questions. Well-appearing; well-nourished HEAD: Normocephalic,  atraumatic EYES: Conjunctivae clear, pupils appear equal, sclera nonicteric ENT: normal nose; moist mucous membranes NECK: Supple, normal ROM CARD: RRR; S1 and S2 appreciated RESP: Normal chest excursion without splinting or tachypnea; breath sounds clear and equal bilaterally; no wheezes, no rhonchi, no rales, no hypoxia or respiratory distress, speaking full sentences ABD/GI: Non-distended; soft, non-tender, no rebound, no guarding, no peritoneal signs BACK: The back appears normal EXT: Normal ROM in all joints; no deformity noted, no edema, no calf tenderness or calf swelling SKIN: Normal color for age and race; warm; no rash on exposed skin NEURO: Moves all extremities equally, normal speech PSYCH: The patient's mood and manner are appropriate.   ED Results / Procedures / Treatments   LABS: (all labs ordered are listed, but only abnormal results are displayed) Labs Reviewed  BASIC METABOLIC PANEL - Abnormal; Notable for the following components:      Result Value   Glucose, Bld 133 (*)    All other components within normal limits  CBC - Abnormal; Notable for the following components:   Hemoglobin 17.1 (*)    All other components within normal limits  TROPONIN I (HIGH SENSITIVITY) - Abnormal; Notable for the following components:   Troponin I (High Sensitivity) 38 (*)    All other components within normal limits  TROPONIN I (HIGH SENSITIVITY) - Abnormal; Notable for the following components:   Troponin I (High Sensitivity) 41 (*)    All other components within normal limits  APTT  PROTIME-INR     EKG:  EKG Interpretation Date/Time:  Wednesday May 23 2023 04:35:54 EDT Ventricular Rate:  75 PR Interval:  188 QRS Duration:  84 QT Interval:  382 QTC Calculation: 426 R Axis:   -32  Text Interpretation: Normal sinus rhythm Left axis deviation Inferior infarct (cited on or before 23-May-2023) Possible Anterior infarct (cited on or before 23-May-2023) Abnormal ECG When  compared with ECG of 23-May-2023 01:51, (unconfirmed) Sinus rhythm has replaced Ectopic atrial rhythm Serial changes of evolving Inferior infarct Present Confirmed by Rochele Raring [phone]) on 05/23/2023 4:43:41 AM         RADIOLOGY: My personal review and interpretation of imaging: Chest x-ray clear.  I have personally reviewed all radiology reports.   DG Chest 2 View Result Date: 05/23/2023 CLINICAL DATA:  Chest tightness. EXAM: CHEST - 2 VIEW COMPARISON:  December 24, 2019 FINDINGS: The heart size and mediastinal contours are within normal limits. Both lungs are clear. A left shoulder replacement is noted. The visualized skeletal structures are otherwise unremarkable.  IMPRESSION: No active cardiopulmonary disease. Electronically Signed   By: Aram Candela M.D.   On: 05/23/2023 02:38     PROCEDURES:  Critical Care performed: Yes, see critical care procedure note(s)   CRITICAL CARE Performed by: Baxter Hire Nelline Lio   Total critical care time: 40 minutes  Critical care time was exclusive of separately billable procedures and treating other patients.  Critical care was necessary to treat or prevent imminent or life-threatening deterioration.  Critical care was time spent personally by me on the following activities: development of treatment plan with patient and/or surrogate as well as nursing, discussions with consultants, evaluation of patient's response to treatment, examination of patient, obtaining history from patient or surrogate, ordering and performing treatments and interventions, ordering and review of laboratory studies, ordering and review of radiographic studies, pulse oximetry and re-evaluation of patient's condition.   Marland Kitchen1-3 Lead EKG Interpretation  Performed by: Deven Furia, Layla Maw, DO Authorized by: Rayford Williamsen, Layla Maw, DO     Interpretation: normal     ECG rate:  80   ECG rate assessment: normal     Rhythm: sinus rhythm     Ectopy: none     Conduction: normal        IMPRESSION / MDM / ASSESSMENT AND PLAN / ED COURSE  I reviewed the triage vital signs and the nursing notes.    Patient here with chest pain.  Currently asymptomatic.  The patient is on the cardiac monitor to evaluate for evidence of arrhythmia and/or significant heart rate changes.   DIFFERENTIAL DIAGNOSIS (includes but not limited to):   ACS, PE, dissection, pneumonia, pneumothorax, chest wall pain, CHF   Patient's presentation is most consistent with acute presentation with potential threat to life or bodily function.   PLAN: Workup initiated from triage.  Initial EKG showed significant artifact with mild ST elevation in anterior leads, Q waves in the inferior leads.  Repeat EKG again redemonstrates Q waves in the inferior leads, ST depression in lateral leads, without criteria for STEMI.  Currently he is completely asymptomatic.  His first troponin is 38.  He is hemodynamically stable.  No risk factors for PE other than age.  Doubt dissection.  Hemoglobin elevated at 17.1 possibly from hemoconcentration versus smoking.  Normal creatinine, electrolytes.  Second troponin pending.  Given concerning story with abnormal EKG and elevated troponin, will give aspirin and start heparin.  Discussed with patient and wife my concerns given elevated troponin with Q waves in the inferior leads with no old EKG for comparison that this likely represents an NSTEMI that occurred on Monday.  Recommended admission and they are agreeable to this plan.  He does not have a cardiologist.   MEDICATIONS GIVEN IN ED: Medications  heparin ADULT infusion 100 units/mL (25000 units/235mL) (1,300 Units/hr Intravenous New Bag/Given 05/23/23 0514)  aspirin chewable tablet 324 mg (324 mg Oral Given 05/23/23 0444)  heparin bolus via infusion 4,000 Units (4,000 Units Intravenous Bolus from Bag 05/23/23 0514)     ED COURSE:  Second troponin is 41.  Still asymptomatic.  Will discuss with hospitalist for  admission.   CONSULTS:  Consulted and discussed patient's case with hospitalist, Dr. Para March.  I have recommended admission and consulting physician agrees and will place admission orders.  Patient (and family if present) agree with this plan.   I reviewed all nursing notes, vitals, pertinent previous records.  All labs, EKGs, imaging ordered have been independently reviewed and interpreted by myself.    OUTSIDE RECORDS REVIEWED: Reviewed prior  podiatry notes in 2021.       FINAL CLINICAL IMPRESSION(S) / ED DIAGNOSES   Final diagnoses:  NSTEMI (non-ST elevated myocardial infarction) (HCC)     Rx / DC Orders   ED Discharge Orders     None        Note:  This document was prepared using Dragon voice recognition software and may include unintentional dictation errors.   Leona Pressly, Layla Maw, DO 05/23/23 [phone]

## 2023-05-23 NOTE — ED Triage Notes (Signed)
 Pt to ED via POV c/o tightness in center of chest. Pt reports it started last night around 10pm. Got worse when he laid on his side. Endorses SHOB and dizziness. Hx HTN

## 2023-05-23 NOTE — ED Notes (Signed)
 RN sent 1130 Heparin level.

## 2023-05-23 NOTE — ED Notes (Addendum)
 Patient in cardiology stress test unable to administer heparin bolus at this time.

## 2023-05-23 NOTE — Progress Notes (Signed)
 ANTICOAGULATION CONSULT NOTE  Pharmacy Consult for heparin infusion Indication: ACS/STEMI  Allergies  Allergen Reactions   Codeine Nausea Only    Itching also   Hydrocodone     Other Reaction(s): Not available  hydrocodone    Patient Measurements: Height: 6' (182.9 cm) Weight: 104.3 kg (230 lb) IBW/kg (Calculated) : 77.6 Heparin Dosing Weight: 99.2 kg  Vital Signs: Temp: 98.8 F (37.1 C) (03/19 1708) Temp Source: Oral (03/19 1708) BP: 115/81 (03/19 1530) Pulse Rate: 73 (03/19 1530)  Labs: Recent Labs    05/23/23 0154 05/23/23 0440 05/23/23 0511 05/23/23 1251 05/23/23 2145  HGB 17.1*  --   --   --   --   HCT 48.6  --   --   --   --   PLT 243  --   --   --   --   APTT  --   --  31  --   --   LABPROT  --   --  14.0  --   --   INR  --   --  1.1  --   --   HEPARINUNFRC  --   --   --  0.27* 0.27*  CREATININE 0.79  --   --   --   --   TROPONINIHS 38* 41*  --   --   --     Estimated Creatinine Clearance: 115 mL/min (by C-G formula based on SCr of 0.79 mg/dL).   Medical History: Past Medical History:  Diagnosis Date   Arthritis    Headache    hx cluster headaches   Hypertension    Pneumonia 2015   bacterial    Assessment: Pt is a 63 yo male presenting to ED "c/o tightness in center of chest. Pt reports it started last night around 10pm." Pt also reporting SOB & dizziness.  Initial troponin I level found to be slightly elevated.  Date/Time HL Rate  Comment 3/19 1251 0.27 1300 units/hr Subtherapeutic  3/19 2145 0.27 1500 units/hr SUBtherapeutic  Goal of Therapy:  Heparin level 0.3-0.7 units/ml Monitor platelets by anticoagulation protocol: Yes   Plan:  Heparin level subtherapeutic  Bolus 1500 units x 1 Increase heparin infusion rate to 1700 units/hr Will check HL in 6 hr after rate change CBC daily while on heparin  Thank you for involving pharmacy in this patient's care.   Rockwell Alexandria, PharmD Clinical Pharmacist 05/23/2023 10:11 PM

## 2023-05-23 NOTE — Consult Note (Signed)
 Cardiology Consultation   Patient ID: Gerald Garza MRN: [id number]; DOB: 06/18/1960  Admit date: 05/23/2023 Date of Consult: 05/23/2023  PCP:  [name], MD (Inactive)   [facility] Providers Cardiologist:  New   Patient Profile:   Gerald Garza is a 63 y.o. male with a hx of HTN and tobacco use who is being seen 05/23/2023 for the evaluation of chest pain at the request of Dr. Chipper Herb.  History of Present Illness:   Gerald Garza has no prior cardiac history. He smokes 1/2 ppd. He takes medication for HTN. No h/o diabetes or high cholesterol.  On Monday he was int he bed. He laid on his left ide and felt chest tightness. He sat up and pain was improved with moving. He thought chest was sore. Yesterday he started doing a lot of yard work. He was sitting in recliner and started having the same chest pain. It was more pronounced. Wife said to go to the ER. BP 180/103.   In the ER BP 151/86, 20 RR, pulse 83bpm, 95% O2. EKG showed NSR with nonspecific ST/T wave changes. Labs showed HS trop 38>41. Hgb 17.1. normal BMET. CXR normal.  He was started on IV heparin and admitted for further work-up.  Past Medical History:  Diagnosis Date   Arthritis    Headache    hx cluster headaches   Hypertension    Pneumonia 2015   bacterial    Past Surgical History:  Procedure Laterality Date   bunion surgery Left    right hammer to done also   COLONOSCOPY WITH PROPOFOL N/A 10/12/2015   Procedure: COLONOSCOPY WITH PROPOFOL;  Surgeon: Charolett Bumpers, MD;  Location: [facility];  Service: Endoscopy;  Laterality: N/A;   REPLACEMENT TOTAL KNEE Bilateral    TONSILLECTOMY  age 84     Home Medications:  Prior to Admission medications   Medication Sig Start Date End Date Taking? Authorizing Provider  albuterol (VENTOLIN HFA) 108 (90 Base) MCG/ACT inhaler Inhale 1-2 puffs into the lungs every 6 (six) hours as needed for wheezing or shortness of breath. 08/26/22   Immordino,  Jeannett Senior, FNP  amLODipine (NORVASC) 5 MG tablet amlodipine 5 mg tablet    [provider]  glucosamine-chondroitin 500-400 MG tablet Take 1 tablet by mouth 2 (two) times daily.    [provider]  hydrocortisone-pramoxine (PROCTOFOAM HC) rectal foam Proctofoam HC 1 %-1 %  APPLY RECTALLY 4 TIMES A DAY AS NEEDED    [provider]  lisinopril-hydrochlorothiazide (PRINZIDE,ZESTORETIC) 20-25 MG tablet Take 1 tablet by mouth daily.    [provider]  Magnesium 250 MG TABS Take by mouth 2 (two) times daily.    [provider]  Multiple Vitamin (MULTI-VITAMIN DAILY PO) Take by mouth.    [provider]  Naproxen Sodium (ALEVE PO) Take 1 tablet by mouth daily.    [provider]  Omega-3 Fatty Acids (OMEGA-3 EPA FISH OIL PO) Take by mouth. 3 per day    [provider]  predniSONE (STERAPRED UNI-PAK 21 TAB) 10 MG (21) TBPK tablet Take by mouth daily. Take 6 tabs by mouth daily for 1 day, then 5 tabs for 1 day, then 4 tabs for 1 day, then 3 tabs for 1 day, then 2 tabs for 1 day, then 1 tab by mouth daily for 1 day 08/26/22   Immordino, Stephen, FNP  vitamin B-12 (CYANOCOBALAMIN) 250 MCG tablet Take 250 mcg by mouth daily.    [provider]    Inpatient Medications: Scheduled Meds:  amLODipine  5 mg Oral Daily   lisinopril  20 mg Oral Daily   And   hydrochlorothiazide  25 mg Oral Daily   Continuous Infusions:  heparin 1,300 Units/hr (05/23/23 0728)   PRN Meds: acetaminophen, albuterol, HYDROmorphone (DILAUDID) injection, ondansetron (ZOFRAN) IV  Allergies:    Allergies  Allergen Reactions   Codeine Nausea Only    Itching also    Social History:   Social History   Socioeconomic History   Marital status: Married    Spouse name: Not on file   Number of children: Not on file   Years of education: Not on file   Highest education level: Not on file  Occupational History   Not on file  Tobacco Use   Smoking  status: Every Day    Current packs/day: 0.50    Average packs/day: 0.5 packs/day for 40.0 years (20.0 ttl pk-yrs)    Types: Cigarettes   Smokeless tobacco: Never  Substance and Sexual Activity   Alcohol use: Yes    Comment: beer weekends   Drug use: No   Sexual activity: Not on file  Other Topics Concern   Not on file  Social History Narrative   Not on file   Social Drivers of Health   Financial Resource Strain: Not on file  Food Insecurity: Not on file  Transportation Needs: Not on file  Physical Activity: Not on file  Stress: Not on file  Social Connections: Not on file  Intimate Partner Violence: Not on file    Family History:   History reviewed. No pertinent family history.   ROS:  Please see the history of present illness.   All other ROS reviewed and negative.     Physical Exam/Data:   Vitals:   05/23/23 0430 05/23/23 0445 05/23/23 0554 05/23/23 0947  BP: 138/86 (!) 140/92    Pulse: 74 80    Resp:  20    Temp:   98.3 F (36.8 C) 98.1 F (36.7 C)  TempSrc:   Oral Oral  SpO2:  97%    Weight:      Height:       No intake or output data in the 24 hours ending 05/23/23 1006    05/23/2023    1:50 AM 10/12/2015    7:55 AM  Last 3 Weights  Weight (lbs) 230 lb 210 lb  Weight (kg) 104.327 kg 95.255 kg     Body mass index is 31.19 kg/m.  General:  Well nourished, well developed, in no acute distress HEENT: normal Neck: no JVD Vascular: No carotid bruits; Distal pulses 2+ bilaterally Cardiac:  normal S1, S2; RRR; no murmur  Lungs:  clear to auscultation bilaterally, no wheezing, rhonchi or rales  Abd: soft, nontender, no hepatomegaly  Ext: no edema Musculoskeletal:  No deformities, BUE and BLE strength normal and equal Skin: warm and dry  Neuro:  CNs 2-12 intact, no focal abnormalities noted Psych:  Normal affect   EKG:  The EKG was personally reviewed and demonstrates:  NSR 84bpm, nonspecific ST/T wave changes Telemetry:  Telemetry was personally  reviewed and demonstrates:  NSR HR 80s  Relevant CV Studies:  Echo ordered  Laboratory Data:  High Sensitivity Troponin:   Recent Labs  Lab 05/23/23 0154 05/23/23 0440  TROPONINIHS 38* 41*     Chemistry Recent Labs  Lab 05/23/23 0154  NA 139  K 3.6  CL 107  CO2 22  GLUCOSE 133*  BUN 16  CREATININE 0.79  CALCIUM 9.0  GFRNONAA >60  ANIONGAP 10    No results for input(s): "PROT", "ALBUMIN", "AST", "ALT", "ALKPHOS", "BILITOT" in the last 168 hours. Lipids No results for input(s): "CHOL", "TRIG", "HDL", "LABVLDL", "LDLCALC", "CHOLHDL" in the last 168 hours.  Hematology Recent Labs  Lab 05/23/23 0154  WBC 9.5  RBC 5.16  HGB 17.1*  HCT 48.6  MCV 94.2  MCH 33.1  MCHC 35.2  RDW 13.7  PLT 243   Thyroid No results for input(s): "TSH", "FREET4" in the last 168 hours.  BNPNo results for input(s): "BNP", "PROBNP" in the last 168 hours.  DDimer No results for input(s): "DDIMER" in the last 168 hours.   Radiology/Studies:  DG Chest 2 View Result Date: 05/23/2023 CLINICAL DATA:  Chest tightness. EXAM: CHEST - 2 VIEW COMPARISON:  December 24, 2019 FINDINGS: The heart size and mediastinal contours are within normal limits. Both lungs are clear. A left shoulder replacement is noted. The visualized skeletal structures are otherwise unremarkable. IMPRESSION: No active cardiopulmonary disease. Electronically Signed   By: Aram Candela M.D.   On: 05/23/2023 02:38     Assessment and Plan:   NSTEMI Chest pain - patient presents with chest pain after doing yard work found to have mildly elevated troponin. May have MSK component to it - HS trop up to 38>41, continue to trend - EKG showed nonspecific ST/T wave changes - continue IV heparin - he was taking ASA for the last week. Start ASA 81mg  daily - check lipid panel - echo ordered - plan for a Myoview lexiscan  HTN - BP mildly elevated - PTA lisinopril 20mg  daily and hydrochlorothiazide 25mg  daily and amlodipine 5mg   daily>Restarted on admission  Tobacco use - smokes 1/2 ppd - cessation recommended   Risk Assessment/Risk Scores:     TIMI Risk Score for Unstable Angina or Non-ST Elevation MI:   The patient's TIMI risk score is 3, which indicates a 13% risk of all cause mortality, new or recurrent myocardial infarction or need for urgent revascularization in the next 14 days.   For questions or updates, please contact [facility] Please consult [email or web address] for contact info under    Signed, Alee Katen David Stall, PA-C  05/23/2023 10:06 AM

## 2023-05-23 NOTE — Progress Notes (Signed)
 ANTICOAGULATION CONSULT NOTE  Pharmacy Consult for heparin infusion Indication: ACS/STEMI  Allergies  Allergen Reactions   Codeine Nausea Only    Itching also    Patient Measurements: Height: 6' (182.9 cm) Weight: 104.3 kg (230 lb) IBW/kg (Calculated) : 77.6 Heparin Dosing Weight: 99.2 kg  Vital Signs: Temp: 98 F (36.7 C) (03/19 0153) Temp Source: Oral (03/19 0153) BP: 140/92 (03/19 0445) Pulse Rate: 80 (03/19 0445)  Labs: Recent Labs    05/23/23 0154  HGB 17.1*  HCT 48.6  PLT 243  CREATININE 0.79  TROPONINIHS 38*    Estimated Creatinine Clearance: 115 mL/min (by C-G formula based on SCr of 0.79 mg/dL).   Medical History: Past Medical History:  Diagnosis Date   Arthritis    Headache    hx cluster headaches   Hypertension    Pneumonia 2015   bacterial    Assessment: Pt is a 63 yo male presenting to ED "c/o tightness in center of chest. Pt reports it started last night around 10pm." Pt also reporting SOB & dizziness.  Initial troponin I level found to be slightly elevated.  Goal of Therapy:  Heparin level 0.3-0.7 units/ml Monitor platelets by anticoagulation protocol: Yes   Plan:  Bolus 4000 units x 1 Start heparin infusion at 1300 units/hr Will check HL in 6 hr after start of infusion CBC daily while on heparin  Otelia Sergeant, PharmD, [facility] 05/23/2023 4:48 AM

## 2023-05-23 NOTE — H&P (Signed)
 History and Physical    Gerald Garza:[id number] DOB: [birth year] DOA: 05/23/2023  PCP: [name], MD (Inactive) (Confirm with patient/family/NH records and if not entered, this has to be entered at [facility] point of entry) Patient coming from: Home  I have personally briefly reviewed patient's old medical records in [facility]  Chief Complaint: Chest pain  HPI: Gerald Garza is a 63 y.o. male with medical history significant of HTN, multiple OA status post knee and hip replacement, presented with new onset chest pains.  2 episode of chest pain Monday night and last night.  First episode happened while patient was sleeping around 11 PM on Monday night, " tightness in the chest but no chest pains" associated with shortness of breath, and patient was thinking the chest pain might come from muscle sprains and twisted and then turned several times and the pain subsided in about 30-40 minutes.  Last night, patient started to have more severe episode and woke him up during sleep, centrally located, tightness like, associated with shortness of breath but no nauseous vomiting no sweating and wife called 911 immediately.  Patient reported that his brother has early CAD in the age of 25s.  He does not smoke.  ED Course: Afebrile, nontachycardic nonhypotensive and nonhypoxic.  Chest x-ray showed no acute infiltrates.  EKG showed questionable Q waves on V1 but no previous EKG to compare with.  Troponin trending 38> 41.  And patient was started on heparin drip  Review of Systems: As per HPI otherwise 14 point review of systems negative.    Past Medical History:  Diagnosis Date   Arthritis    Headache    hx cluster headaches   Hypertension    Pneumonia 2015   bacterial    Past Surgical History:  Procedure Laterality Date   bunion surgery Left    right hammer to done also   COLONOSCOPY WITH PROPOFOL N/A 10/12/2015   Procedure: COLONOSCOPY WITH PROPOFOL;  Surgeon: Charolett Bumpers,  MD;  Location: [facility];  Service: Endoscopy;  Laterality: N/A;   REPLACEMENT TOTAL KNEE Bilateral    TONSILLECTOMY  age 59     reports that he has been smoking cigarettes. He has a 20 pack-year smoking history. He has never used smokeless tobacco. He reports current alcohol use. He reports that he does not use drugs.  Allergies  Allergen Reactions   Codeine Nausea Only    Itching also    History reviewed. No pertinent family history.   Prior to Admission medications   Medication Sig Start Date End Date Taking? Authorizing Provider  albuterol (VENTOLIN HFA) 108 (90 Base) MCG/ACT inhaler Inhale 1-2 puffs into the lungs every 6 (six) hours as needed for wheezing or shortness of breath. 08/26/22   Immordino, Jeannett Senior, FNP  amLODipine (NORVASC) 5 MG tablet amlodipine 5 mg tablet    [provider]  glucosamine-chondroitin 500-400 MG tablet Take 1 tablet by mouth 2 (two) times daily.    [provider]  hydrocortisone-pramoxine (PROCTOFOAM HC) rectal foam Proctofoam HC 1 %-1 %  APPLY RECTALLY 4 TIMES A DAY AS NEEDED    [provider]  lisinopril-hydrochlorothiazide (PRINZIDE,ZESTORETIC) 20-25 MG tablet Take 1 tablet by mouth daily.    [provider]  Magnesium 250 MG TABS Take by mouth 2 (two) times daily.    [provider]  Multiple Vitamin (MULTI-VITAMIN DAILY PO) Take by mouth.    [provider]  Naproxen Sodium (ALEVE PO) Take 1 tablet  by mouth daily.    [provider]  Omega-3 Fatty Acids (OMEGA-3 EPA FISH OIL PO) Take by mouth. 3 per day    [provider]  predniSONE (STERAPRED UNI-PAK 21 TAB) 10 MG (21) TBPK tablet Take by mouth daily. Take 6 tabs by mouth daily for 1 day, then 5 tabs for 1 day, then 4 tabs for 1 day, then 3 tabs for 1 day, then 2 tabs for 1 day, then 1 tab by mouth daily for 1 day 08/26/22   Immordino, Stephen, FNP  vitamin B-12 (CYANOCOBALAMIN) 250 MCG tablet Take 250 mcg by mouth daily.     [provider]    Physical Exam: Vitals:   05/23/23 0445 05/23/23 0554 05/23/23 0947 05/23/23 1014  BP: (!) 140/92   (!) 153/94  Pulse: 80     Resp: 20     Temp:  98.3 F (36.8 C) 98.1 F (36.7 C)   TempSrc:  Oral Oral   SpO2: 97%     Weight:      Height:        Constitutional: NAD, calm, comfortable Vitals:   05/23/23 0445 05/23/23 0554 05/23/23 0947 05/23/23 1014  BP: (!) 140/92   (!) 153/94  Pulse: 80     Resp: 20     Temp:  98.3 F (36.8 C) 98.1 F (36.7 C)   TempSrc:  Oral Oral   SpO2: 97%     Weight:      Height:       Eyes: PERRL, lids and conjunctivae normal ENMT: Mucous membranes are moist. Posterior pharynx clear of any exudate or lesions.Normal dentition.  Neck: normal, supple, no masses, no thyromegaly Respiratory: clear to auscultation bilaterally, no wheezing, no crackles. Normal respiratory effort. No accessory muscle use.  Cardiovascular: Regular rate and rhythm, no murmurs / rubs / gallops. No extremity edema. 2+ pedal pulses. No carotid bruits.  Abdomen: no tenderness, no masses palpated. No hepatosplenomegaly. Bowel sounds positive.  Musculoskeletal: no clubbing / cyanosis. No joint deformity upper and lower extremities. Good ROM, no contractures. Normal muscle tone.  Skin: no rashes, lesions, ulcers. No induration Neurologic: CN 2-12 grossly intact. Sensation intact, DTR normal. Strength 5/5 in all 4.  Psychiatric: Normal judgment and insight. Alert and oriented x 3. Normal mood.     Labs on Admission: I have personally reviewed following labs and imaging studies  CBC: Recent Labs  Lab 05/23/23 0154  WBC 9.5  HGB 17.1*  HCT 48.6  MCV 94.2  PLT 243   Basic Metabolic Panel: Recent Labs  Lab 05/23/23 0154  NA 139  K 3.6  CL 107  CO2 22  GLUCOSE 133*  BUN 16  CREATININE 0.79  CALCIUM 9.0   GFR: Estimated Creatinine Clearance: 115 mL/min (by C-G formula based on SCr of 0.79 mg/dL). Liver Function Tests: No results for  input(s): "AST", "ALT", "ALKPHOS", "BILITOT", "PROT", "ALBUMIN" in the last 168 hours. No results for input(s): "LIPASE", "AMYLASE" in the last 168 hours. No results for input(s): "AMMONIA" in the last 168 hours. Coagulation Profile: Recent Labs  Lab 05/23/23 0511  INR 1.1   Cardiac Enzymes: No results for input(s): "CKTOTAL", "CKMB", "CKMBINDEX", "TROPONINI" in the last 168 hours. BNP (last 3 results) No results for input(s): "PROBNP" in the last 8760 hours. HbA1C: No results for input(s): "HGBA1C" in the last 72 hours. CBG: No results for input(s): "GLUCAP" in the last 168 hours. Lipid Profile: No results for input(s): "CHOL", "HDL", "LDLCALC", "TRIG", "CHOLHDL", "LDLDIRECT"  in the last 72 hours. Thyroid Function Tests: No results for input(s): "TSH", "T4TOTAL", "FREET4", "T3FREE", "THYROIDAB" in the last 72 hours. Anemia Panel: No results for input(s): "VITAMINB12", "FOLATE", "FERRITIN", "TIBC", "IRON", "RETICCTPCT" in the last 72 hours. Urine analysis: No results found for: "COLORURINE", "APPEARANCEUR", "LABSPEC", "PHURINE", "GLUCOSEU", "HGBUR", "BILIRUBINUR", "KETONESUR", "PROTEINUR", "UROBILINOGEN", "NITRITE", "LEUKOCYTESUR"  Radiological Exams on Admission: DG Chest 2 View Result Date: 05/23/2023 CLINICAL DATA:  Chest tightness. EXAM: CHEST - 2 VIEW COMPARISON:  December 24, 2019 FINDINGS: The heart size and mediastinal contours are within normal limits. Both lungs are clear. A left shoulder replacement is noted. The visualized skeletal structures are otherwise unremarkable. IMPRESSION: No active cardiopulmonary disease. Electronically Signed   By: Aram Candela M.D.   On: 05/23/2023 02:38    EKG: Independently reviewed.  Sinus rhythm, Q waves on V1, but no previous EKG to compare with otherwise no acute ST changes.  Assessment/Plan Principal Problem:   Unstable angina (HCC) Active Problems:   Angina at rest [facility])  (please populate well all problems here in Problem  List. (For example, if patient is on BP meds at home and you resume or decide to hold them, it is a problem that needs to be her. Same for CAD, COPD, HLD and so on)  Anginal-like chest pain with troponin elevation -Discussed with cardiology, who ordered stress test -Echocardiogram -Start aspirin -Lipid panel, outpatient A1c -Continue ACS medication including heparin drip lisinopril. -Other DDx, patient denied any history of GERD, denied history of OSA.  Clinically, low suspicion for PE.  HTN -Controlled, continue amlodipine and lisinopril  DVT prophylaxis: Heparin drip Code Status: Full code Family Communication: Wife at bedside Disposition Plan: Expect less than 2 midnight hospital stay Consults called: Cardiology Admission status: Telemetry observation   Emeline General MD [facility] Pager [phone]  05/23/2023, 10:21 AM

## 2023-05-24 ENCOUNTER — Encounter: Payer: Self-pay | Admitting: Internal Medicine

## 2023-05-24 ENCOUNTER — Encounter
Admission: EM | Disposition: A | Discharge status: Home / Self Care | Payer: Self-pay | Source: Home / Self Care | Attending: Emergency Medicine

## 2023-05-24 ENCOUNTER — Other Ambulatory Visit: Payer: Self-pay

## 2023-05-24 ENCOUNTER — Observation Stay (HOSPITAL_COMMUNITY)
Admit: 2023-05-24 | Discharge: 2023-05-24 | Disposition: A | Discharge status: Home / Self Care | Attending: Cardiovascular Disease | Admitting: Cardiovascular Disease

## 2023-05-24 DIAGNOSIS — R079 Chest pain, unspecified: Secondary | ICD-10-CM | POA: Diagnosis not present

## 2023-05-24 DIAGNOSIS — I2089 Other forms of angina pectoris: Secondary | ICD-10-CM | POA: Diagnosis not present

## 2023-05-24 DIAGNOSIS — F17200 Nicotine dependence, unspecified, uncomplicated: Secondary | ICD-10-CM | POA: Diagnosis not present

## 2023-05-24 DIAGNOSIS — I200 Unstable angina: Secondary | ICD-10-CM

## 2023-05-24 DIAGNOSIS — I25118 Atherosclerotic heart disease of native coronary artery with other forms of angina pectoris: Secondary | ICD-10-CM | POA: Diagnosis not present

## 2023-05-24 DIAGNOSIS — E782 Mixed hyperlipidemia: Secondary | ICD-10-CM | POA: Diagnosis not present

## 2023-05-24 DIAGNOSIS — I214 Non-ST elevation (NSTEMI) myocardial infarction: Principal | ICD-10-CM

## 2023-05-24 DIAGNOSIS — I10 Essential (primary) hypertension: Secondary | ICD-10-CM | POA: Diagnosis not present

## 2023-05-24 DIAGNOSIS — R9439 Abnormal result of other cardiovascular function study: Secondary | ICD-10-CM

## 2023-05-24 HISTORY — PX: CORONARY STENT INTERVENTION: CATH118234

## 2023-05-24 HISTORY — PX: LEFT HEART CATH AND CORONARY ANGIOGRAPHY: CATH118249

## 2023-05-24 LAB — ECHOCARDIOGRAM COMPLETE
AR max vel: 2.82 cm2
AV Area VTI: 3.79 cm2
AV Area mean vel: 3.1 cm2
AV Mean grad: 2 mmHg
AV Peak grad: 4.6 mmHg
Ao pk vel: 1.07 m/s
Area-P 1/2: 1.91 cm2
Height: 72 in
MV VTI: 2.7 cm2
S' Lateral: 2.8 cm
Weight: 3680 [oz_av]

## 2023-05-24 LAB — CBC
HCT: 44.2 % (ref 39.0–52.0)
Hemoglobin: 15.4 g/dL (ref 13.0–17.0)
MCH: 33.3 pg (ref 26.0–34.0)
MCHC: 34.8 g/dL (ref 30.0–36.0)
MCV: 95.5 fL (ref 80.0–100.0)
Platelets: 191 10*3/uL (ref 150–400)
RBC: 4.63 MIL/uL (ref 4.22–5.81)
RDW: 14.1 % (ref 11.5–15.5)
WBC: 7 10*3/uL (ref 4.0–10.5)
nRBC: 0 % (ref 0.0–0.2)

## 2023-05-24 LAB — POCT ACTIVATED CLOTTING TIME
Activated Clotting Time: 251 s
Activated Clotting Time: 256 s
Activated Clotting Time: 245 s

## 2023-05-24 LAB — HEPARIN LEVEL (UNFRACTIONATED)
Heparin Unfractionated: 0.45 [IU]/mL (ref 0.30–0.70)
Heparin Unfractionated: 0.37 [IU]/mL (ref 0.30–0.70)

## 2023-05-24 SURGERY — LEFT HEART CATH AND CORONARY ANGIOGRAPHY
Anesthesia: Moderate Sedation

## 2023-05-24 MED ORDER — HEPARIN SODIUM (PORCINE) 1000 UNIT/ML IJ SOLN
INTRAMUSCULAR | Status: AC
  Filled 2023-05-24: qty 10

## 2023-05-24 MED ORDER — PRASUGREL HCL 10 MG PO TABS: 60.0000 mg | ORAL_TABLET | Freq: Once | ORAL | Status: DC

## 2023-05-24 MED ORDER — HEPARIN (PORCINE) IN NACL 2000-0.9 UNIT/L-% IV SOLN
INTRAVENOUS | Status: DC | PRN
  Administered 2023-05-24: 1000 mL

## 2023-05-24 MED ORDER — MIDAZOLAM HCL 2 MG/2ML IJ SOLN
INTRAMUSCULAR | Status: DC | PRN
  Administered 2023-05-24: 2 mg via INTRAVENOUS

## 2023-05-24 MED ORDER — METOPROLOL SUCCINATE ER 25 MG PO TB24
12.5000 mg | ORAL_TABLET | Freq: Every day | ORAL | Status: DC
  Administered 2023-05-25: 12.5 mg via ORAL
  Filled 2023-05-24: qty 1

## 2023-05-24 MED ORDER — SODIUM CHLORIDE 0.9% FLUSH
3.0000 mL | Freq: Two times a day (BID) | INTRAVENOUS | Status: DC
  Administered 2023-05-24 – 2023-05-25 (×2): 3 mL via INTRAVENOUS

## 2023-05-24 MED ORDER — MIDAZOLAM HCL 2 MG/2ML IJ SOLN
INTRAMUSCULAR | Status: AC
Start: 2023-05-24 — End: ?
  Filled 2023-05-24: qty 2

## 2023-05-24 MED ORDER — PRASUGREL HCL 10 MG PO TABS
60.0000 mg | ORAL_TABLET | Freq: Once | ORAL | Status: DC
  Filled 2023-05-24: qty 6

## 2023-05-24 MED ORDER — NITROGLYCERIN 1 MG/10 ML FOR IR/CATH LAB
INTRA_ARTERIAL | Status: DC | PRN
  Administered 2023-05-24: 400 ug
  Administered 2023-05-24: 200 ug

## 2023-05-24 MED ORDER — SODIUM CHLORIDE 0.9 % WEIGHT BASED INFUSION: 3.0000 mL/kg/h | INTRAVENOUS | Status: DC

## 2023-05-24 MED ORDER — HEPARIN SODIUM (PORCINE) 1000 UNIT/ML IJ SOLN
INTRAMUSCULAR | Status: DC | PRN
  Administered 2023-05-24: 5000 [IU] via INTRAVENOUS
  Administered 2023-05-24 (×2): 3000 [IU] via INTRAVENOUS
  Administered 2023-05-24: 5000 [IU] via INTRAVENOUS

## 2023-05-24 MED ORDER — ONDANSETRON HCL 4 MG/2ML IJ SOLN: 4.0000 mg | Freq: Four times a day (QID) | INTRAMUSCULAR | Status: DC | PRN

## 2023-05-24 MED ORDER — ROSUVASTATIN CALCIUM 10 MG PO TABS
20.0000 mg | ORAL_TABLET | Freq: Every day | ORAL | Status: DC
Start: 2023-05-24 — End: 2023-05-25
  Administered 2023-05-24: 20 mg via ORAL
  Filled 2023-05-24: qty 2

## 2023-05-24 MED ORDER — FENTANYL CITRATE (PF) 100 MCG/2ML IJ SOLN
INTRAMUSCULAR | Status: DC | PRN
  Administered 2023-05-24: 25 ug via INTRAVENOUS

## 2023-05-24 MED ORDER — SODIUM CHLORIDE 0.9 % WEIGHT BASED INFUSION: 1.0000 mL/kg/h | INTRAVENOUS | Status: DC

## 2023-05-24 MED ORDER — VERAPAMIL HCL 2.5 MG/ML IV SOLN
INTRAVENOUS | Status: AC
  Filled 2023-05-24: qty 2

## 2023-05-24 MED ORDER — SODIUM CHLORIDE 0.9 % IV SOLN: 250.0000 mL | INTRAVENOUS | Status: DC | PRN

## 2023-05-24 MED ORDER — VERAPAMIL HCL 2.5 MG/ML IV SOLN
INTRAVENOUS | Status: DC | PRN
  Administered 2023-05-24: 2.5 mg via INTRAVENOUS

## 2023-05-24 MED ORDER — PRASUGREL HCL 10 MG PO TABS
10.0000 mg | ORAL_TABLET | Freq: Every day | ORAL | Status: DC
  Filled 2023-05-24: qty 1

## 2023-05-24 MED ORDER — SODIUM CHLORIDE 0.9% FLUSH: 3.0000 mL | INTRAVENOUS | Status: DC | PRN

## 2023-05-24 MED ORDER — LABETALOL HCL 5 MG/ML IV SOLN: 10.0000 mg | INTRAVENOUS | Status: AC | PRN

## 2023-05-24 MED ORDER — PRASUGREL HCL 10 MG PO TABS
ORAL_TABLET | ORAL | Status: DC | PRN
  Administered 2023-05-24: 60 mg via ORAL

## 2023-05-24 MED ORDER — FENTANYL CITRATE (PF) 100 MCG/2ML IJ SOLN
INTRAMUSCULAR | Status: AC
  Filled 2023-05-24: qty 2

## 2023-05-24 MED ORDER — PRASUGREL HCL 10 MG PO TABS
ORAL_TABLET | ORAL | Status: AC
  Filled 2023-05-24: qty 2

## 2023-05-24 MED ORDER — LIDOCAINE HCL (PF) 1 % IJ SOLN
INTRAMUSCULAR | Status: DC | PRN
  Administered 2023-05-24: 2 mL

## 2023-05-24 MED ORDER — SODIUM CHLORIDE 0.9 % IV SOLN: INTRAVENOUS | Status: AC

## 2023-05-24 MED ORDER — HEPARIN SODIUM (PORCINE) 1000 UNIT/ML IJ SOLN
INTRAMUSCULAR | Status: AC
Start: 2023-05-24 — End: ?
  Filled 2023-05-24: qty 10

## 2023-05-24 MED ORDER — HYDRALAZINE HCL 20 MG/ML IJ SOLN: 10.0000 mg | INTRAMUSCULAR | Status: AC | PRN

## 2023-05-24 MED ORDER — ASPIRIN 81 MG PO CHEW: 81.0000 mg | CHEWABLE_TABLET | ORAL | Status: DC

## 2023-05-24 MED ORDER — IOHEXOL 300 MG/ML  SOLN
INTRAMUSCULAR | Status: DC | PRN
  Administered 2023-05-24: 327 mL

## 2023-05-24 MED ORDER — SODIUM CHLORIDE 0.9 % WEIGHT BASED INFUSION
3.0000 mL/kg/h | INTRAVENOUS | Status: DC
  Administered 2023-05-24: 3 mL/kg/h via INTRAVENOUS

## 2023-05-24 SURGICAL SUPPLY — 22 items
BALLN TREK RX 2.5X15 (BALLOONS) ×1 IMPLANT
BALLN ~~LOC~~ TREK NEO RX 2.75X15 (BALLOONS) ×1 IMPLANT
BALLOON TREK RX 2.5X15 (BALLOONS) IMPLANT
BALLOON ~~LOC~~ TREK NEO RX 2.75X15 (BALLOONS) IMPLANT
CATH INFINITI 5FR MULTPACK ANG (CATHETERS) IMPLANT
CATH INFINITI AMBI 5FR TG (CATHETERS) IMPLANT
CATH VISTA GUIDE 6FR JL4 (CATHETERS) IMPLANT
CATH VISTA GUIDE 6FR XBLD 3.5 (CATHETERS) IMPLANT
DEVICE RAD TR BAND REGULAR (VASCULAR PRODUCTS) IMPLANT
DRAPE BRACHIAL (DRAPES) IMPLANT
GLIDESHEATH SLEND SS 6F .021 (SHEATH) IMPLANT
GUIDEWIRE INQWIRE 1.5J.035X260 (WIRE) IMPLANT
INQWIRE 1.5J .035X260CM (WIRE) ×1 IMPLANT
KIT ENCORE 26 ADVANTAGE (KITS) IMPLANT
PACK CARDIAC CATH (CUSTOM PROCEDURE TRAY) ×1 IMPLANT
PROTECTION STATION PRESSURIZED (MISCELLANEOUS) ×1 IMPLANT
SET ATX-X65L (MISCELLANEOUS) IMPLANT
STATION PROTECTION PRESSURIZED (MISCELLANEOUS) IMPLANT
STENT ONYX FRONTIER 2.5X22 (Permanent Stent) IMPLANT
WIRE ASAHI PROWATER 180CM (WIRE) IMPLANT
WIRE RUNTHROUGH .014X180CM (WIRE) IMPLANT
WIRE RUNTHROUGH IZANAI 014 180 (WIRE) IMPLANT

## 2023-05-24 NOTE — H&P (View-Only) (Signed)
 Rounding Note    Patient Name: Gerald Garza Date of Encounter: 05/24/2023  [facility] Cardiologist: new to [facility]  Subjective   Remains on heparin infusion No further chest pain overnight Stress test results pulled up and reviewed with him, large region mid to apical septal defect, partially fixed with some ischemia  Echocardiogram with no focal wall motion abnormality  Inpatient Medications    Scheduled Meds:  amLODipine  5 mg Oral Daily   [START ON 05/25/2023] aspirin  81 mg Oral Pre-Cath   aspirin EC  81 mg Oral Daily   lisinopril  20 mg Oral Daily   And   hydrochlorothiazide  25 mg Oral Daily   Continuous Infusions:  sodium chloride     Followed by   sodium chloride     heparin 1,700 Units/hr (05/23/23 2258)   PRN Meds: acetaminophen   Vital Signs    Vitals:   05/24/23 0824 05/24/23 0950 05/24/23 1130 05/24/23 1228  BP: 123/86 119/80 117/76   Pulse: 67  70   Resp: (!) 23  18   Temp:    98.4 F (36.9 C)  TempSrc:    Oral  SpO2: 95%  90%   Weight:      Height:       No intake or output data in the 24 hours ending 05/24/23 1300    05/23/2023    1:50 AM 10/12/2015    7:55 AM  Last 3 Weights  Weight (lbs) 230 lb 210 lb  Weight (kg) 104.327 kg 95.255 kg      Telemetry    Normal sinus rhythm- Personally Reviewed  ECG     - Personally Reviewed  Physical Exam   GEN: No acute distress.   Neck: No JVD Cardiac: RRR, no murmurs, rubs, or gallops.  Respiratory: Clear to auscultation bilaterally. GI: Soft, nontender, non-distended  MS: No edema; No deformity. Neuro:  Nonfocal  Psych: Normal affect   Labs    High Sensitivity Troponin:   Recent Labs  Lab 05/23/23 0154 05/23/23 0440  TROPONINIHS 38* 41*     Chemistry Recent Labs  Lab 05/23/23 0154  NA 139  K 3.6  CL 107  CO2 22  GLUCOSE 133*  BUN 16  CREATININE 0.79  CALCIUM 9.0  GFRNONAA >60  ANIONGAP 10    Lipids  Recent Labs  Lab 05/23/23 1017  CHOL 200  TRIG  368*  HDL 41  LDLCALC 85  CHOLHDL 4.9    Hematology Recent Labs  Lab 05/23/23 0154 05/24/23 0715  WBC 9.5 7.0  RBC 5.16 4.63  HGB 17.1* 15.4  HCT 48.6 44.2  MCV 94.2 95.5  MCH 33.1 33.3  MCHC 35.2 34.8  RDW 13.7 14.1  PLT 243 191   Thyroid  Recent Labs  Lab 05/23/23 1017  TSH 1.022    BNPNo results for input(s): "BNP", "PROBNP" in the last 168 hours.  DDimer No results for input(s): "DDIMER" in the last 168 hours.   Radiology    ECHOCARDIOGRAM COMPLETE Result Date: 05/24/2023    ECHOCARDIOGRAM REPORT   Patient Name:   [name] Date of Exam: 05/24/2023 Medical Rec #:  [id number]        Height:       72.0 in Accession #:    [id number]       Weight:       230.0 lb Date of Birth:  06-22-1960         BSA:  2.261 m Patient Age:    63 years         BP:           110/70 mmHg Patient Gender: M                HR:           58 bpm. Exam Location:  [facility] Procedure: 2D Echo, Cardiac Doppler, Color Doppler and Strain Analysis (Both            Spectral and Color Flow Doppler were utilized during procedure). Indications:     Chest pain R07.9  History:         Patient has no prior history of Echocardiogram examinations.                  Risk Factors:Hypertension.  Sonographer:     Cristela Blue Referring Phys:  [id number] Antonieta Iba Diagnosing Phys: Julien Nordmann MD  Sonographer Comments: Global longitudinal strain was attempted. IMPRESSIONS  1. Left ventricular ejection fraction, by estimation, is 55 to 60%. Left ventricular ejection fraction by 3D volume is 54 %. The left ventricle has normal function. The left ventricle has no regional wall motion abnormalities. Left ventricular diastolic  parameters are consistent with Grade I diastolic dysfunction (impaired relaxation). The average left ventricular global longitudinal strain is -16.7 %. The global longitudinal strain is normal.  2. Right ventricular systolic function is normal. The right ventricular size is normal. There is normal  pulmonary artery systolic pressure. The estimated right ventricular systolic pressure is 22.1 mmHg.  3. The mitral valve is normal in structure. No evidence of mitral valve regurgitation. No evidence of mitral stenosis.  4. The aortic valve is normal in structure. Aortic valve regurgitation is not visualized. No aortic stenosis is present.  5. The inferior vena cava is normal in size with greater than 50% respiratory variability, suggesting right atrial pressure of 3 mmHg. FINDINGS  Left Ventricle: Left ventricular ejection fraction, by estimation, is 55 to 60%. Left ventricular ejection fraction by 3D volume is 54 %. The left ventricle has normal function. The left ventricle has no regional wall motion abnormalities. The average left ventricular global longitudinal strain is -16.7 %. Strain was performed and the global longitudinal strain is normal. The left ventricular internal cavity size was normal in size. There is no left ventricular hypertrophy. Left ventricular diastolic parameters are consistent with Grade I diastolic dysfunction (impaired relaxation). Right Ventricle: The right ventricular size is normal. No increase in right ventricular wall thickness. Right ventricular systolic function is normal. There is normal pulmonary artery systolic pressure. The tricuspid regurgitant velocity is 2.07 m/s, and  with an assumed right atrial pressure of 5 mmHg, the estimated right ventricular systolic pressure is 22.1 mmHg. Left Atrium: Left atrial size was normal in size. Right Atrium: Right atrial size was normal in size. Pericardium: There is no evidence of pericardial effusion. Mitral Valve: The mitral valve is normal in structure. No evidence of mitral valve regurgitation. No evidence of mitral valve stenosis. MV peak gradient, 5.0 mmHg. The mean mitral valve gradient is 2.0 mmHg. Tricuspid Valve: The tricuspid valve is normal in structure. Tricuspid valve regurgitation is not demonstrated. No evidence of  tricuspid stenosis. Aortic Valve: The aortic valve is normal in structure. Aortic valve regurgitation is not visualized. No aortic stenosis is present. Aortic valve mean gradient measures 2.0 mmHg. Aortic valve peak gradient measures 4.6 mmHg. Aortic valve area, by VTI measures 3.79 cm. Pulmonic Valve: The  pulmonic valve was normal in structure. Pulmonic valve regurgitation is not visualized. No evidence of pulmonic stenosis. Aorta: The aortic root is normal in size and structure. Venous: The inferior vena cava is normal in size with greater than 50% respiratory variability, suggesting right atrial pressure of 3 mmHg. IAS/Shunts: No atrial level shunt detected by color flow Doppler. Additional Comments: 3D was performed not requiring image post processing on an independent workstation and was normal.  LEFT VENTRICLE PLAX 2D LVIDd:         4.10 cm         Diastology LVIDs:         2.80 cm         LV e' medial:   5.33 cm/s LV PW:         1.20 cm         LV E/e' medial: 8.3 LV IVS:        1.60 cm LVOT diam:     2.30 cm         2D Longitudinal LV SV:         78              Strain LV SV Index:   34              2D Strain GLS   -16.7 % LVOT Area:     4.15 cm        Avg:                                 3D Volume EF                                LV 3D EF:    Left                                             ventricul                                             ar                                             ejection                                             fraction                                             by 3D                                             volume is  54 %.                                 3D Volume EF:                                3D EF:        54 %                                LV EDV:       224 ml                                LV ESV:       103 ml                                LV SV:        120 ml RIGHT VENTRICLE RV Basal diam:  3.00 cm RV Mid diam:     1.90 cm RV S prime:     10.30 cm/s TAPSE (M-mode): 2.2 cm LEFT ATRIUM             Index        RIGHT ATRIUM           Index LA diam:        3.10 cm 1.37 cm/m   RA Area:     17.70 cm LA Vol (A2C):   42.7 ml 18.89 ml/m  RA Volume:   46.10 ml  20.39 ml/m LA Vol (A4C):   31.1 ml 13.76 ml/m LA Biplane Vol: 38.7 ml 17.12 ml/m  AORTIC VALVE AV Area (Vmax):    2.82 cm AV Area (Vmean):   3.10 cm AV Area (VTI):     3.79 cm AV Vmax:           107.00 cm/s AV Vmean:          71.800 cm/s AV VTI:            0.205 m AV Peak Grad:      4.6 mmHg AV Mean Grad:      2.0 mmHg LVOT Vmax:         72.70 cm/s LVOT Vmean:        53.600 cm/s LVOT VTI:          0.187 m LVOT/AV VTI ratio: 0.91  AORTA Ao Root diam: 3.50 cm MITRAL VALVE               TRICUSPID VALVE MV Area (PHT): 1.91 cm    TR Peak grad:   17.1 mmHg MV Area VTI:   2.70 cm    TR Vmax:        207.00 cm/s MV Peak grad:  5.0 mmHg MV Mean grad:  2.0 mmHg    SHUNTS MV Vmax:       1.12 m/s    Systemic VTI:  0.19 m MV Vmean:      57.3 cm/s   Systemic Diam: 2.30 cm MV Decel Time: 398 msec MV E velocity: 44.00 cm/s MV A velocity: 95.40 cm/s MV E/A ratio:  0.46 Julien Nordmann MD Electronically signed by Julien Nordmann MD Signature Date/Time: 05/24/2023/11:32:46 AM  Final    NM Myocar Multi W/Spect W/Wall Motion / EF Result Date: 05/23/2023 Pharmacological myocardial perfusion imaging study with large region of fixed and reversible defect in the mid to distal septal and apical region Hypokinesis in the apical, apical septal region, EF estimated at 58% No EKG changes concerning for ischemia at peak stress or in recovery. CT attenuation correction images with mild coronary calcification High risk scan Signed, Dossie Arbour, MD, Ph.D [facility]   DG Chest 2 View Result Date: 05/23/2023 CLINICAL DATA:  Chest tightness. EXAM: CHEST - 2 VIEW COMPARISON:  December 24, 2019 FINDINGS: The heart size and mediastinal contours are within normal limits. Both lungs are clear. A left  shoulder replacement is noted. The visualized skeletal structures are otherwise unremarkable. IMPRESSION: No active cardiopulmonary disease. Electronically Signed   By: Aram Candela M.D.   On: 05/23/2023 02:38    Cardiac Studies   Echo 1. Left ventricular ejection fraction, by estimation, is 55 to 60%. Left  ventricular ejection fraction by 3D volume is 54 %. The left ventricle has  normal function. The left ventricle has no regional wall motion  abnormalities. Left ventricular diastolic   parameters are consistent with Grade I diastolic dysfunction (impaired  relaxation). The average left ventricular global longitudinal strain is  -16.7 %. The global longitudinal strain is normal.   2. Right ventricular systolic function is normal. The right ventricular  size is normal. There is normal pulmonary artery systolic pressure. The  estimated right ventricular systolic pressure is 22.1 mmHg.   3. The mitral valve is normal in structure. No evidence of mitral valve  regurgitation. No evidence of mitral stenosis.   4. The aortic valve is normal in structure. Aortic valve regurgitation is  not visualized. No aortic stenosis is present.   5. The inferior vena cava is normal in size with greater than 50%  respiratory variability, suggesting right atrial pressure of 3 mmHg.   Patient Profile     Mr. Fielding Mault is a 63 year old gentleman with history of smoking, hypertension who presents with chest pain, stress test yesterday positive for ischemia  Assessment & Plan    Chest pain Typical and atypical features, worse laying on his side some improvement sitting up Chest pain 2 nights in a row, improved on heparin infusion Stress test yesterday showing septal wall, apical perfusion defect with some reversible ischemia, images pulled up and reviewed -Echocardiogram with no focal wall motion abnormality, normal EF -Scheduled for cardiac catheterization today 1230 I have reviewed the risks,  indications, and alternatives to cardiac catheterization, possible angioplasty, and stenting with the patient. Risks include but are not limited to bleeding, infection, vascular injury, stroke, myocardial infection, arrhythmia, kidney injury, radiation-related injury in the case of prolonged fluoroscopy use, emergency cardiac surgery, and death. The patient understands the risks of serious complication is 1-2 in 1000 with diagnostic cardiac cath and 1-2% or less with angioplasty/stenting.   Essential hypertension  continue lisinopril 20 daily HCTZ 25 daily amlodipine 5 daily Add metoprolol succinate 12.5 daily   Hyperlipidemia Add Crestor 20 daily for goal LDL less than 55   Smoker -Smoking cessation recommended, continues to smoke 1/2 pack/day     For questions or updates, please contact [facility] Please consult [email or web address] for contact info under        Signed, Julien Nordmann, MD  05/24/2023, 1:00 PM

## 2023-05-24 NOTE — Progress Notes (Signed)
*  PRELIMINARY RESULTS* Echocardiogram 2D Echocardiogram has been performed.  Cristela Blue 05/24/2023, 8:07 AM

## 2023-05-24 NOTE — Progress Notes (Signed)
 ANTICOAGULATION CONSULT NOTE  Pharmacy Consult for heparin infusion Indication: ACS/STEMI  Allergies  Allergen Reactions   Codeine Nausea Only    Itching also   Hydrocodone     Other Reaction(s): Not available  hydrocodone    Patient Measurements: Height: 6' (182.9 cm) Weight: 104.3 kg (230 lb) IBW/kg (Calculated) : 77.6 Heparin Dosing Weight: 99.2 kg  Vital Signs: Temp: 97.9 F (36.6 C) (03/20 0740) Temp Source: Oral (03/20 0740) BP: 110/70 (03/20 0530) Pulse Rate: 58 (03/20 0530)  Labs: Recent Labs    05/23/23 0154 05/23/23 0440 05/23/23 0511 05/23/23 1251 05/23/23 2145 05/24/23 0715  HGB 17.1*  --   --   --   --  15.4  HCT 48.6  --   --   --   --  44.2  PLT 243  --   --   --   --  191  APTT  --   --  31  --   --   --   LABPROT  --   --  14.0  --   --   --   INR  --   --  1.1  --   --   --   HEPARINUNFRC  --   --   --  0.27* 0.27* 0.45  CREATININE 0.79  --   --   --   --   --   TROPONINIHS 38* 41*  --   --   --   --     Estimated Creatinine Clearance: 115 mL/min (by C-G formula based on SCr of 0.79 mg/dL).   Medical History: Past Medical History:  Diagnosis Date   Arthritis    Headache    hx cluster headaches   Hypertension    Pneumonia 2015   bacterial    Assessment: Pt is a 63 yo male presenting to ED "c/o tightness in center of chest. Pt reports it started last night around 10pm." Pt also reporting SOB & dizziness.  Initial troponin I level found to be slightly elevated.  Date/Time HL Rate  Comment 3/19 1251 0.27 1300 units/hr Subtherapeutic  3/19 2145 0.27 1500 units/hr SUBtherapeutic 3/20 0715 0.45 1700 units/hr Therapeutic x 1  Goal of Therapy:  Heparin level 0.3-0.7 units/ml Monitor platelets by anticoagulation protocol: Yes   Plan:  HL is therapeutic x 1 Continue heparin infusion at 1700 units/hr Recheck HL in 6 hours to confirm Daily CBC while on heparin  Thank you for involving pharmacy in this patient's care.   Haelee Bolen  "Trey" Jasper Riling, PharmD Clinical Pharmacist 05/24/2023 8:11 AM

## 2023-05-24 NOTE — Progress Notes (Signed)
  Progress Note   Patient: Gerald Garza UVO:[id number] DOB: [birth year] DOA: 05/23/2023     0 DOS: the patient was seen and examined on 05/24/2023   Brief hospital course: "Gerald Garza is a 63 y.o. male with medical history significant of HTN, multiple OA status post knee and hip replacement, presented with new onset chest pains.   2 episode of chest pain Monday night and last night.  First episode happened while patient was sleeping around 11 PM on Monday night, " tightness in the chest but no chest pains" associated with shortness of breath, and patient was thinking the chest pain might come from muscle sprains and twisted and then turned several times and the pain subsided in about 30-40 minutes.  Last night, patient started to have more severe episode and woke him up during sleep, centrally located, tightness like, associated with shortness of breath but no nauseous vomiting no sweating and wife called 911 immediately.  Patient reported that his brother has early CAD in the age of 32s.  He does not smoke.   ED Course: Afebrile, nontachycardic nonhypotensive and nonhypoxic.  Chest x-ray showed no acute infiltrates.  EKG showed questionable Q waves on V1 but no previous EKG to compare with.  Troponin trending 38> 41.  And patient was started on heparin drip"  Pt was admitted to Medicine service with Cardiology consulted for further evaluation and management.  Further hospital course and management as outlined below.  3/20 - cardiology plans for cath this afternoon.   Assessment and Plan:  Anginal-like chest pain with troponin elevation --Cardiology following --Plan for cath this afternoon --Heparin drip  --Echocardiogram - pending --Aspirin, lisinopril --   HTN --Controlled, continue amlodipine and lisinopril  Hyperlipidemia - statin  Active smoker  - 1/2 ppd Extensive tobacco cessation counseling provided:       Subjective: Pt seen in this AM in ED, holding for a bed.  He denies chest pains or dyspnea.  Says cardiology doing another test later. Denies other acute complaints or issues.   Physical Exam: Vitals:   05/24/23 0824 05/24/23 0950 05/24/23 1130 05/24/23 1228  BP: 123/86 119/80 117/76   Pulse: 67  70   Resp: (!) 23  18   Temp:    98.4 F (36.9 C)  TempSrc:    Oral  SpO2: 95%  90%   Weight:      Height:       General exam: awake, alert, no acute distress HEENT: moist mucus membranes, hearing grossly normal  Respiratory system: CTA, no wheezes, rales or rhonchi, normal respiratory effort. Cardiovascular system: normal S1/S2, RRR, no JVD, murmurs, rubs, gallops, no pedal edema.   Gastrointestinal system: soft, NT, ND, no HSM felt, +bowel sounds. Central nervous system: A&O x 3. no gross focal neurologic deficits, normal speech Extremities: moves all, no edema, normal tone Skin: dry, intact, normal temperature Psychiatry: normal mood, congruent affect, judgement and insight appear normal   Data Reviewed:  Notable labs --   Family Communication: Pt updated in detail.  Pt is able to update family.  Disposition: Status is: Observation Remains admitted for further Cardiac evaluation including cath today.    Planned Discharge Destination: Home    Time spent: 45 minutes  Author: Pennie Banter, DO 05/24/2023 12:35 PM  For on call review [email or web address].

## 2023-05-24 NOTE — Interval H&P Note (Signed)
 History and Physical Interval Note:  05/24/2023 3:02 PM  PROSPERO MAHNKE  has presented today for surgery, with the diagnosis of unstable angina with abnormal Myoview stress test.  The various methods of treatment have been discussed with the patient and family. After consideration of risks, benefits and other options for treatment, the patient has consented to  Procedure(s): LEFT HEART CATH AND CORONARY ANGIOGRAPHY (N/A)  PERCUTANEOUS CORONARY INTERVENTION  as a surgical intervention.  The patient's history has been reviewed, patient examined, no change in status, stable for surgery.  I have reviewed the patient's chart and labs.  Questions were answered to the patient's satisfaction.    Cath Lab Visit (complete for each Cath Lab visit)  Clinical Evaluation Leading to the Procedure:   ACS: Yes.    Non-ACS:    Anginal Classification: CCS III  Anti-ischemic medical therapy: Minimal Therapy (1 class of medications)  Non-Invasive Test Results: High-risk stress test findings: cardiac mortality >3%/year  Prior CABG: No previous CABG   Bryan Lemma

## 2023-05-24 NOTE — Progress Notes (Signed)
 Rounding Note    Patient Name: Gerald Garza Date of Encounter: 05/24/2023  [facility] Cardiologist: new to [facility]  Subjective   Remains on heparin infusion No further chest pain overnight Stress test results pulled up and reviewed with him, large region mid to apical septal defect, partially fixed with some ischemia  Echocardiogram with no focal wall motion abnormality  Inpatient Medications    Scheduled Meds:  amLODipine  5 mg Oral Daily   [START ON 05/25/2023] aspirin  81 mg Oral Pre-Cath   aspirin EC  81 mg Oral Daily   lisinopril  20 mg Oral Daily   And   hydrochlorothiazide  25 mg Oral Daily   Continuous Infusions:  sodium chloride     Followed by   sodium chloride     heparin 1,700 Units/hr (05/23/23 2258)   PRN Meds: acetaminophen   Vital Signs    Vitals:   05/24/23 0824 05/24/23 0950 05/24/23 1130 05/24/23 1228  BP: 123/86 119/80 117/76   Pulse: 67  70   Resp: (!) 23  18   Temp:    98.4 F (36.9 C)  TempSrc:    Oral  SpO2: 95%  90%   Weight:      Height:       No intake or output data in the 24 hours ending 05/24/23 1300    05/23/2023    1:50 AM 10/12/2015    7:55 AM  Last 3 Weights  Weight (lbs) 230 lb 210 lb  Weight (kg) 104.327 kg 95.255 kg      Telemetry    Normal sinus rhythm- Personally Reviewed  ECG     - Personally Reviewed  Physical Exam   GEN: No acute distress.   Neck: No JVD Cardiac: RRR, no murmurs, rubs, or gallops.  Respiratory: Clear to auscultation bilaterally. GI: Soft, nontender, non-distended  MS: No edema; No deformity. Neuro:  Nonfocal  Psych: Normal affect   Labs    High Sensitivity Troponin:   Recent Labs  Lab 05/23/23 0154 05/23/23 0440  TROPONINIHS 38* 41*     Chemistry Recent Labs  Lab 05/23/23 0154  NA 139  K 3.6  CL 107  CO2 22  GLUCOSE 133*  BUN 16  CREATININE 0.79  CALCIUM 9.0  GFRNONAA >60  ANIONGAP 10    Lipids  Recent Labs  Lab 05/23/23 1017  CHOL 200  TRIG  368*  HDL 41  LDLCALC 85  CHOLHDL 4.9    Hematology Recent Labs  Lab 05/23/23 0154 05/24/23 0715  WBC 9.5 7.0  RBC 5.16 4.63  HGB 17.1* 15.4  HCT 48.6 44.2  MCV 94.2 95.5  MCH 33.1 33.3  MCHC 35.2 34.8  RDW 13.7 14.1  PLT 243 191   Thyroid  Recent Labs  Lab 05/23/23 1017  TSH 1.022    BNPNo results for input(s): "BNP", "PROBNP" in the last 168 hours.  DDimer No results for input(s): "DDIMER" in the last 168 hours.   Radiology    ECHOCARDIOGRAM COMPLETE Result Date: 05/24/2023    ECHOCARDIOGRAM REPORT   Patient Name:   Gerald Garza Date of Exam: 05/24/2023 Medical Rec #:  [id number]        Height:       72.0 in Accession #:    [id number]       Weight:       230.0 lb Date of Birth:  06-22-1960         BSA:  2.261 m Patient Age:    63 years         BP:           110/70 mmHg Patient Gender: M                HR:           58 bpm. Exam Location:  [facility] Procedure: 2D Echo, Cardiac Doppler, Color Doppler and Strain Analysis (Both            Spectral and Color Flow Doppler were utilized during procedure). Indications:     Chest pain R07.9  History:         Patient has no prior history of Echocardiogram examinations.                  Risk Factors:Hypertension.  Sonographer:     Cristela Blue Referring Phys:  [id number] Antonieta Iba Diagnosing Phys: Julien Nordmann MD  Sonographer Comments: Global longitudinal strain was attempted. IMPRESSIONS  1. Left ventricular ejection fraction, by estimation, is 55 to 60%. Left ventricular ejection fraction by 3D volume is 54 %. The left ventricle has normal function. The left ventricle has no regional wall motion abnormalities. Left ventricular diastolic  parameters are consistent with Grade I diastolic dysfunction (impaired relaxation). The average left ventricular global longitudinal strain is -16.7 %. The global longitudinal strain is normal.  2. Right ventricular systolic function is normal. The right ventricular size is normal. There is normal  pulmonary artery systolic pressure. The estimated right ventricular systolic pressure is 22.1 mmHg.  3. The mitral valve is normal in structure. No evidence of mitral valve regurgitation. No evidence of mitral stenosis.  4. The aortic valve is normal in structure. Aortic valve regurgitation is not visualized. No aortic stenosis is present.  5. The inferior vena cava is normal in size with greater than 50% respiratory variability, suggesting right atrial pressure of 3 mmHg. FINDINGS  Left Ventricle: Left ventricular ejection fraction, by estimation, is 55 to 60%. Left ventricular ejection fraction by 3D volume is 54 %. The left ventricle has normal function. The left ventricle has no regional wall motion abnormalities. The average left ventricular global longitudinal strain is -16.7 %. Strain was performed and the global longitudinal strain is normal. The left ventricular internal cavity size was normal in size. There is no left ventricular hypertrophy. Left ventricular diastolic parameters are consistent with Grade I diastolic dysfunction (impaired relaxation). Right Ventricle: The right ventricular size is normal. No increase in right ventricular wall thickness. Right ventricular systolic function is normal. There is normal pulmonary artery systolic pressure. The tricuspid regurgitant velocity is 2.07 m/s, and  with an assumed right atrial pressure of 5 mmHg, the estimated right ventricular systolic pressure is 22.1 mmHg. Left Atrium: Left atrial size was normal in size. Right Atrium: Right atrial size was normal in size. Pericardium: There is no evidence of pericardial effusion. Mitral Valve: The mitral valve is normal in structure. No evidence of mitral valve regurgitation. No evidence of mitral valve stenosis. MV peak gradient, 5.0 mmHg. The mean mitral valve gradient is 2.0 mmHg. Tricuspid Valve: The tricuspid valve is normal in structure. Tricuspid valve regurgitation is not demonstrated. No evidence of  tricuspid stenosis. Aortic Valve: The aortic valve is normal in structure. Aortic valve regurgitation is not visualized. No aortic stenosis is present. Aortic valve mean gradient measures 2.0 mmHg. Aortic valve peak gradient measures 4.6 mmHg. Aortic valve area, by VTI measures 3.79 cm. Pulmonic Valve: The  pulmonic valve was normal in structure. Pulmonic valve regurgitation is not visualized. No evidence of pulmonic stenosis. Aorta: The aortic root is normal in size and structure. Venous: The inferior vena cava is normal in size with greater than 50% respiratory variability, suggesting right atrial pressure of 3 mmHg. IAS/Shunts: No atrial level shunt detected by color flow Doppler. Additional Comments: 3D was performed not requiring image post processing on an independent workstation and was normal.  LEFT VENTRICLE PLAX 2D LVIDd:         4.10 cm         Diastology LVIDs:         2.80 cm         LV e' medial:   5.33 cm/s LV PW:         1.20 cm         LV E/e' medial: 8.3 LV IVS:        1.60 cm LVOT diam:     2.30 cm         2D Longitudinal LV SV:         78              Strain LV SV Index:   34              2D Strain GLS   -16.7 % LVOT Area:     4.15 cm        Avg:                                 3D Volume EF                                LV 3D EF:    Left                                             ventricul                                             ar                                             ejection                                             fraction                                             by 3D                                             volume is  54 %.                                 3D Volume EF:                                3D EF:        54 %                                LV EDV:       224 ml                                LV ESV:       103 ml                                LV SV:        120 ml RIGHT VENTRICLE RV Basal diam:  3.00 cm RV Mid diam:     1.90 cm RV S prime:     10.30 cm/s TAPSE (M-mode): 2.2 cm LEFT ATRIUM             Index        RIGHT ATRIUM           Index LA diam:        3.10 cm 1.37 cm/m   RA Area:     17.70 cm LA Vol (A2C):   42.7 ml 18.89 ml/m  RA Volume:   46.10 ml  20.39 ml/m LA Vol (A4C):   31.1 ml 13.76 ml/m LA Biplane Vol: 38.7 ml 17.12 ml/m  AORTIC VALVE AV Area (Vmax):    2.82 cm AV Area (Vmean):   3.10 cm AV Area (VTI):     3.79 cm AV Vmax:           107.00 cm/s AV Vmean:          71.800 cm/s AV VTI:            0.205 m AV Peak Grad:      4.6 mmHg AV Mean Grad:      2.0 mmHg LVOT Vmax:         72.70 cm/s LVOT Vmean:        53.600 cm/s LVOT VTI:          0.187 m LVOT/AV VTI ratio: 0.91  AORTA Ao Root diam: 3.50 cm MITRAL VALVE               TRICUSPID VALVE MV Area (PHT): 1.91 cm    TR Peak grad:   17.1 mmHg MV Area VTI:   2.70 cm    TR Vmax:        207.00 cm/s MV Peak grad:  5.0 mmHg MV Mean grad:  2.0 mmHg    SHUNTS MV Vmax:       1.12 m/s    Systemic VTI:  0.19 m MV Vmean:      57.3 cm/s   Systemic Diam: 2.30 cm MV Decel Time: 398 msec MV E velocity: 44.00 cm/s MV A velocity: 95.40 cm/s MV E/A ratio:  0.46 Julien Nordmann MD Electronically signed by Julien Nordmann MD Signature Date/Time: 05/24/2023/11:32:46 AM  Final    NM Myocar Multi W/Spect W/Wall Motion / EF Result Date: 05/23/2023 Pharmacological myocardial perfusion imaging study with large region of fixed and reversible defect in the mid to distal septal and apical region Hypokinesis in the apical, apical septal region, EF estimated at 58% No EKG changes concerning for ischemia at peak stress or in recovery. CT attenuation correction images with mild coronary calcification High risk scan Signed, Dossie Arbour, MD, Ph.D [facility]   DG Chest 2 View Result Date: 05/23/2023 CLINICAL DATA:  Chest tightness. EXAM: CHEST - 2 VIEW COMPARISON:  December 24, 2019 FINDINGS: The heart size and mediastinal contours are within normal limits. Both lungs are clear. A left  shoulder replacement is noted. The visualized skeletal structures are otherwise unremarkable. IMPRESSION: No active cardiopulmonary disease. Electronically Signed   By: Aram Candela M.D.   On: 05/23/2023 02:38    Cardiac Studies   Echo 1. Left ventricular ejection fraction, by estimation, is 55 to 60%. Left  ventricular ejection fraction by 3D volume is 54 %. The left ventricle has  normal function. The left ventricle has no regional wall motion  abnormalities. Left ventricular diastolic   parameters are consistent with Grade I diastolic dysfunction (impaired  relaxation). The average left ventricular global longitudinal strain is  -16.7 %. The global longitudinal strain is normal.   2. Right ventricular systolic function is normal. The right ventricular  size is normal. There is normal pulmonary artery systolic pressure. The  estimated right ventricular systolic pressure is 22.1 mmHg.   3. The mitral valve is normal in structure. No evidence of mitral valve  regurgitation. No evidence of mitral stenosis.   4. The aortic valve is normal in structure. Aortic valve regurgitation is  not visualized. No aortic stenosis is present.   5. The inferior vena cava is normal in size with greater than 50%  respiratory variability, suggesting right atrial pressure of 3 mmHg.   Patient Profile     Mr. Fielding Mault is a 63 year old gentleman with history of smoking, hypertension who presents with chest pain, stress test yesterday positive for ischemia  Assessment & Plan    Chest pain Typical and atypical features, worse laying on his side some improvement sitting up Chest pain 2 nights in a row, improved on heparin infusion Stress test yesterday showing septal wall, apical perfusion defect with some reversible ischemia, images pulled up and reviewed -Echocardiogram with no focal wall motion abnormality, normal EF -Scheduled for cardiac catheterization today 1230 I have reviewed the risks,  indications, and alternatives to cardiac catheterization, possible angioplasty, and stenting with the patient. Risks include but are not limited to bleeding, infection, vascular injury, stroke, myocardial infection, arrhythmia, kidney injury, radiation-related injury in the case of prolonged fluoroscopy use, emergency cardiac surgery, and death. The patient understands the risks of serious complication is 1-2 in 1000 with diagnostic cardiac cath and 1-2% or less with angioplasty/stenting.   Essential hypertension  continue lisinopril 20 daily HCTZ 25 daily amlodipine 5 daily Add metoprolol succinate 12.5 daily   Hyperlipidemia Add Crestor 20 daily for goal LDL less than 55   Smoker -Smoking cessation recommended, continues to smoke 1/2 pack/day     For questions or updates, please contact [facility] Please consult [email or web address] for contact info under        Signed, Julien Nordmann, MD  05/24/2023, 1:00 PM

## 2023-05-24 NOTE — Brief Op Note (Addendum)
 Brief cardiac catheterization-PCI note:  05/24/2023  5:07 PM PROCEDURE:  Procedure(s): LEFT HEART CATH AND CORONARY ANGIOGRAPHY (N/A) CORONARY STENT INTERVENTION (N/A)  SURGEON:  Surgeons and Role:    * Marykay Lex, MD - Primary   PATIENT:  Gerald Garza is a 63 y.o. male smoker with history of HTN and HLD who presented with signs symptoms concerning for unstable angina with mild troponin elevation.  He was evaluated with a Myoview stress test on 05/23/2023 which revealed partially reversible anteroapical defect read as high risk.  Echocardiogram showed normal EF.  He is now scheduled for cardiac catheterization and possible PCI  PRE-OPERATIVE DIAGNOSIS: Unstable angina, elevated troponin, abnormal nuclear stress test  POST-OPERATIVE DIAGNOSIS:   Severe single-vessel CAD with subacute occlusion of the mid LAD just prior to 3rd Sep & 3rd Diag branches as the likely culprit lesion; also noted 50% ostial proximal major 2nd diag branch.  The 3rd diag branch is relatively small. Successful complex/very difficult revascularization with PTCA-DES PCI of the mid LAD with 2 overlapping Xience frontier DES 2.5 mm x 22 mm stents postdilated a tapered fashion from 2.8 down to 2.6 mm.   (The initial lesion segment was felt to be 22 mm, but are not able to advance the stent due to loss of guide positioning and wire positioning, therefore I changed out the guide catheter and attempted to rewire the vessel is again occluded requiring rewiring and PTCA followed by now a significantly longer stent due to a small area of dissection at the left main lesion site.) Complex intervention due to need to change guide catheter, very difficult lesion to wire with a long residual lesion with spasm and post angioplastied dissection. Otherwise mild to moderate disease in the proximal LAD and proximal RCA with large dominant RCA filling a large posterolateral system. Mildly elevated LVEDP  PROCEDURE  PERFORMED: Time Out: Verified patient identification, verified procedure, site/side was marked, verified correct patient position, special equipment/implants available, medications/allergies/relevent history reviewed, required imaging and test results available. Performed.  Access:  RIGHT radial Artery: 6 Fr sheath -- Seldinger technique using Micropuncture Kit -- Direct ultrasound guidance used.  Permanent image obtained and placed on chart. -- 10 mL radial cocktail IA; 5000 units IV Heparin  Left Heart Catheterization: 5 and 6 Fr Catheters advanced or exchanged over a J-wire under direct fluoroscopic guidance into the ascending aorta; TIG 4.0 catheter advanced first.  * Left Coronary Artery Cineangiography: JL 4 catheter; 6 French JL 4 guide followed by 6 [place] XB LAD 3.5 guide * Right Coronary Artery Cineangiography: JR4 catheter  * LV Hemodynamics (no LV Gram): XB LAD 3.5 guide  Review of initial angiography revealed: Mid LAD 100% occlusion likely subacute based on minimal troponin elevations.  Preparations are made for LAD PCI -> Additional boluses of IV heparin administered to achieve and maintain ACT> 250 seconds -> Brilinta 60 mg administered p.o.  LAD PCI performed: See FINDINGS: -> Initial guide was a 6 [place] JL 4 guide.  Was able to wire the lesion and perform balloon angioplasty with restoration of TIMI-3 flow.  Plans were made to advance the DES stent, however the guide catheter continued to push back at the stent was advanced forward.  Finally was not able to cover the entire lesion therefore the stent was fully removed without being deployed.  At this point my wire and pulled back.  I chose to convert to a 6 [place] XB LAD 3.5 guide catheter which easily was directed into  the LCA and PCI performed using a [place] in a septal branch and Runthrough wire in the LAD.  Additional balloon angioplasty was formed and the lesion was finally stented with 2 overlapping Onyx frontier DES 5  mm x 22 mm each deployed to 2.6 mm and postdilated and taper fashion from 2.8 down to 2.6 mm.  TIMI-3 flow restored. After completing PCI, the guide catheter was then used to cross the aortic valve for measurement of pressures and pullback gradient.  Upon completion of Angiogaphy, the catheter was removed completely out of the body over a wire, without complication.   Radial sheath removed in the Cardiac Catheterization lab with TR Band placed for hemostasis.  TR Band: 1650 Hours; 10 mL air 7: Reverse Barbeau B  MEDICATIONS SQ Lidocaine for mL Radial Cocktail: 3 mg Verapmil in 10 mL NS Heparin: Total of 16,000 units IC NTG 200 mcg x 3 Oral Effient 60 mg  EBL:   < 50 mL  PATIENT DISPOSITION:  PACU - hemodynamically stable.  DICTATION: .Note written in EPIC  PLAN OF CARE: Admit to inpatient the patient will be admitted overnight from the ER through our holding area to the floor.  He will be monitored overnight with titration medications and removal of TR band.   Minimum 1 year DAPT with ASA/Effient-beyond 1 year would continue long-term Thienopyridine SAPT (likely Plavix monotherapy); if necessary could interrupt DAPT at 6 months. Further adjustment of medications.-Lipid management, glycemic control etc. per primary services.    Delay start of Pharmacological VTE agent (>24hrs) due to surgical blood loss or risk of bleeding: not applicable   Bryan Lemma, MD

## 2023-05-25 ENCOUNTER — Encounter: Payer: Self-pay | Admitting: Cardiology

## 2023-05-25 DIAGNOSIS — I200 Unstable angina: Secondary | ICD-10-CM | POA: Diagnosis not present

## 2023-05-25 DIAGNOSIS — Z96653 Presence of artificial knee joint, bilateral: Secondary | ICD-10-CM | POA: Diagnosis present

## 2023-05-25 DIAGNOSIS — I214 Non-ST elevation (NSTEMI) myocardial infarction: Secondary | ICD-10-CM | POA: Diagnosis not present

## 2023-05-25 DIAGNOSIS — Q219 Congenital malformation of cardiac septum, unspecified: Secondary | ICD-10-CM | POA: Diagnosis not present

## 2023-05-25 DIAGNOSIS — J449 Chronic obstructive pulmonary disease, unspecified: Secondary | ICD-10-CM | POA: Diagnosis present

## 2023-05-25 DIAGNOSIS — I2089 Other forms of angina pectoris: Secondary | ICD-10-CM | POA: Diagnosis not present

## 2023-05-25 DIAGNOSIS — I10 Essential (primary) hypertension: Secondary | ICD-10-CM | POA: Diagnosis present

## 2023-05-25 DIAGNOSIS — I2510 Atherosclerotic heart disease of native coronary artery without angina pectoris: Secondary | ICD-10-CM | POA: Diagnosis present

## 2023-05-25 DIAGNOSIS — Z885 Allergy status to narcotic agent status: Secondary | ICD-10-CM | POA: Diagnosis not present

## 2023-05-25 DIAGNOSIS — E782 Mixed hyperlipidemia: Secondary | ICD-10-CM | POA: Diagnosis present

## 2023-05-25 DIAGNOSIS — Z8249 Family history of ischemic heart disease and other diseases of the circulatory system: Secondary | ICD-10-CM | POA: Diagnosis not present

## 2023-05-25 DIAGNOSIS — Z79899 Other long term (current) drug therapy: Secondary | ICD-10-CM | POA: Diagnosis not present

## 2023-05-25 DIAGNOSIS — F17210 Nicotine dependence, cigarettes, uncomplicated: Secondary | ICD-10-CM | POA: Diagnosis present

## 2023-05-25 LAB — CBC
HCT: 42.4 % (ref 39.0–52.0)
Hemoglobin: 14.9 g/dL (ref 13.0–17.0)
MCH: 33 pg (ref 26.0–34.0)
MCHC: 35.1 g/dL (ref 30.0–36.0)
MCV: 94 fL (ref 80.0–100.0)
Platelets: 176 10*3/uL (ref 150–400)
RBC: 4.51 MIL/uL (ref 4.22–5.81)
RDW: 13.8 % (ref 11.5–15.5)
WBC: 6.8 10*3/uL (ref 4.0–10.5)
nRBC: 0 % (ref 0.0–0.2)

## 2023-05-25 LAB — BASIC METABOLIC PANEL
Anion gap: 10 (ref 5–15)
BUN: 19 mg/dL (ref 8–23)
CO2: 24 mmol/L (ref 22–32)
Calcium: 8.9 mg/dL (ref 8.9–10.3)
Chloride: 106 mmol/L (ref 98–111)
Creatinine, Ser: 0.82 mg/dL (ref 0.61–1.24)
GFR, Estimated: >60 mL/min (ref >60)
Glucose, Bld: 98 mg/dL (ref 70–99)
Potassium: 3.5 mmol/L (ref 3.5–5.1)
Sodium: 140 mmol/L (ref 135–145)

## 2023-05-25 MED ORDER — AMLODIPINE BESYLATE 5 MG PO TABS: 5.0000 mg | ORAL_TABLET | Freq: Every day | ORAL | 2 refills | Status: DC

## 2023-05-25 MED ORDER — PRASUGREL HCL 10 MG PO TABS: 10.0000 mg | ORAL_TABLET | Freq: Every day | ORAL | 2 refills | Status: DC

## 2023-05-25 MED ORDER — ROSUVASTATIN CALCIUM 20 MG PO TABS: 20.0000 mg | ORAL_TABLET | Freq: Every day | ORAL | 2 refills | Status: DC

## 2023-05-25 MED ORDER — ASPIRIN 81 MG PO TBEC: 81.0000 mg | DELAYED_RELEASE_TABLET | Freq: Every day | ORAL | 12 refills | Status: AC

## 2023-05-25 MED ORDER — METOPROLOL SUCCINATE ER 25 MG PO TB24: 12.5000 mg | ORAL_TABLET | Freq: Every day | ORAL | 2 refills | Status: DC

## 2023-05-25 NOTE — Progress Notes (Signed)
 Cardiology Progress Note   Patient Name: Gerald Garza Date of Encounter: 05/25/2023  Primary Cardiologist: Julien Nordmann, MD  Subjective   S/p PCI/DES x 2 to LAD yesterday.  Feels well this AM.  Ambulating w/o difficulty.  Eager to go home.  Objective   Inpatient Medications    Scheduled Meds:  amLODipine  5 mg Oral Daily   aspirin EC  81 mg Oral Daily   lisinopril  20 mg Oral Daily   And   hydrochlorothiazide  25 mg Oral Daily   metoprolol succinate  12.5 mg Oral Daily   prasugrel  10 mg Oral Daily   prasugrel  60 mg Oral Once   rosuvastatin  20 mg Oral QHS   sodium chloride flush  3 mL Intravenous Q12H   Continuous Infusions:  sodium chloride     PRN Meds: sodium chloride, acetaminophen, ondansetron (ZOFRAN) IV, sodium chloride flush   Vital Signs    Vitals:   05/24/23 2019 05/25/23 0005 05/25/23 0340 05/25/23 0735  BP: 126/84 130/76 121/77 123/80  Pulse: 77 81 65 66  Resp: 16 18 18 18   Temp: 98.4 F (36.9 C) 98.3 F (36.8 C) 97.6 F (36.4 C) 97.8 F (36.6 C)  TempSrc: Oral     SpO2: 95% 96% 95% 96%  Weight: 102.5 kg     Height: 6' (1.829 m)       Intake/Output Summary (Last 24 hours) at 05/25/2023 1133 Last data filed at 05/24/2023 2300 Gross per 24 hour  Intake 400 ml  Output 550 ml  Net -150 ml   Filed Weights   05/23/23 0150 05/24/23 1433 05/24/23 2019  Weight: 104.3 kg 104.3 kg 102.5 kg    Physical Exam   GEN: Well nourished, well developed, in no acute distress.  HEENT: Grossly normal.  Neck: Supple, no JVD, carotid bruits, or masses. Cardiac: RRR, no murmurs, rubs, or gallops. No clubbing, cyanosis, edema.  Radials 2+, DP/PT 2+ and equal bilaterally.  R radial cath site w/o bleeding/bruit/hematoma. Respiratory:  Respirations regular and unlabored, clear to auscultation bilaterally. GI: Soft, nontender, nondistended, BS + x 4. MS: no deformity or atrophy. Skin: warm and dry, no rash. Neuro:  Strength and sensation are  intact. Psych: AAOx3.  Normal affect.  Labs    Chemistry Recent Labs  Lab 05/23/23 0154 05/25/23 0503  NA 139 140  K 3.6 3.5  CL 107 106  CO2 22 24  GLUCOSE 133* 98  BUN 16 19  CREATININE 0.79 0.82  CALCIUM 9.0 8.9  GFRNONAA >60 >60  ANIONGAP 10 10     Hematology Recent Labs  Lab 05/23/23 0154 05/24/23 0715 05/25/23 0503  WBC 9.5 7.0 6.8  RBC 5.16 4.63 4.51  HGB 17.1* 15.4 14.9  HCT 48.6 44.2 42.4  MCV 94.2 95.5 94.0  MCH 33.1 33.3 33.0  MCHC 35.2 34.8 35.1  RDW 13.7 14.1 13.8  PLT 243 191 176    Cardiac Enzymes  Recent Labs  Lab 05/23/23 0154 05/23/23 0440  TROPONINIHS 38* 41*     Lipids  Lab Results  Component Value Date   CHOL 200 05/23/2023   HDL 41 05/23/2023   LDLCALC 85 05/23/2023   TRIG 368 (H) 05/23/2023   CHOLHDL 4.9 05/23/2023   Radiology    DG Chest 2 View Result Date: 05/23/2023 CLINICAL DATA:  Chest tightness. EXAM: CHEST - 2 VIEW COMPARISON:  December 24, 2019 FINDINGS: The heart size and mediastinal contours are within normal limits. Both lungs are  clear. A left shoulder replacement is noted. The visualized skeletal structures are otherwise unremarkable. IMPRESSION: No active cardiopulmonary disease. Electronically Signed   By: Aram Candela M.D.   On: 05/23/2023 02:38     Telemetry    rsr - Personally Reviewed  ECG    RSR, 60, inferior infarct - Personally Reviewed  Cardiac Studies   Lexiscan Cardiolite 3.19.2025  Pharmacological myocardial perfusion imaging study with large region of fixed and reversible defect in the mid to distal septal and apical region Hypokinesis in the apical, apical septal region, EF estimated at 58% No EKG changes concerning for ischemia at peak stress or in recovery. CT attenuation correction images with mild coronary calcification High risk scan _____________   2D Echocardiogram 3.20.2025  1. Left ventricular ejection fraction, by estimation, is 55 to 60%. Left  ventricular ejection  fraction by 3D volume is 54 %. The left ventricle has  normal function. The left ventricle has no regional wall motion  abnormalities. Left ventricular diastolic   parameters are consistent with Grade I diastolic dysfunction (impaired  relaxation). The average left ventricular global longitudinal strain is  -16.7 %. The global longitudinal strain is normal.   2. Right ventricular systolic function is normal. The right ventricular  size is normal. There is normal pulmonary artery systolic pressure. The  estimated right ventricular systolic pressure is 22.1 mmHg.   3. The mitral valve is normal in structure. No evidence of mitral valve  regurgitation. No evidence of mitral stenosis.   4. The aortic valve is normal in structure. Aortic valve regurgitation is  not visualized. No aortic stenosis is present.   5. The inferior vena cava is normal in size with greater than 50%  respiratory variability, suggesting right atrial pressure of 3 mmHg.  _____________   Cardiac Catheterization and Percutaneous Coronary Intervention 3.20.2025 Diagnostic: Dominance: Right                                                     Intervention   POST-OPERATIVE DIAGNOSIS:   Severe single-vessel CAD with subacute occlusion of the mid LAD just prior to 3rd Sep & 3rd Diag branches as the likely culprit lesion; also noted 50% ostial proximal major 2nd diag branch.  The 3rd diag branch is relatively small. Successful complex/very difficult revascularization with PTCA-DES PCI of the mid LAD with 2 overlapping Xience frontier DES 2.5 mm x 22 mm stents postdilated a tapered fashion from 2.8 down to 2.6 mm.   (The initial lesion segment was felt to be 22 mm, but are not able to advance the stent due to loss of guide positioning and wire positioning, therefore I changed out the guide catheter and attempted to rewire the vessel is again occluded requiring rewiring and PTCA followed by now a significantly longer stent due to a  small area of dissection at the left main lesion site.) Complex intervention due to need to change guide catheter, very difficult lesion to wire with a long residual lesion with spasm and post angioplastied dissection. Otherwise mild to moderate disease in the proximal LAD and proximal RCA with large dominant RCA filling a large posterolateral system. Mildly elevated LVEDP     PLAN OF CARE: Admit to inpatient the patient will be admitted overnight from the ER through our holding area to the floor.  He will be  monitored overnight with titration medications and removal of TR band.   In the absence of any other complications or medical issues, we expect the patient to be ready for discharge from an interventional cardiology perspective on 05/25/2023. Start statin and adjust BP meds per rounding cardiology team Recommend uninterrupted dual antiplatelet therapy with Aspirin 81mg  daily and Prasugrel 10mg  daily for a minimum of 12 months (ACS-Class I recommendation). Based on the length of stent, after 1 year DAPT, would de-escalate to Thienopyridine monotherapy-Plavix 75 mg SAPT long-term.  _____________   Patient Profile     63 y.o. male w/ a h/o HTN and tob abuse, admitted w/ chest pain and mild hsTrop elevation, now s/p PCI/DES x 2 to the occluded LAD.  Assessment & Plan    1.  NSTEMI/CAD:  Admitted w/ chest pain w/ hsTrop to 41.  S/p PCI/DES to the occluded LAD on 3/20.  Tolerated well and has since ambulated w/o recurrent c/p.  Cont asa, prasugrel, and statin rx.  DAPT x 12 mos w/ plan for thienopyridine monotherapy thereafter given length of stented segments.  ? blocker not previously initiated due to relative bradycardia w/ rates freq in low 60's.  Ok for d/c today.  We will arrange outpt cardiology f/u.  Rec outpt cardiac rehab to pt.    2.  Primary HTN:  stable on acei/hydrochlorothiazide.  3.  HL:  LDL 85 w/ TG of 368.  Cont high potency statin w/ plan to f/u lipids/lfts in 6-8 wks in outpt  setting and low threshold to add vascepa for ongoing HTG.  4.  Tobaccoa Abuse:  Cessation advised.  Signed, Nicolasa Ducking, NP  05/25/2023, 11:33 AM    For questions or updates, please contact   Please consult [email or web address] for contact info under Cardiology/STEMI.

## 2023-05-25 NOTE — Discharge Summary (Signed)
 Physician Discharge Summary   Patient: Gerald Garza MRN: [id number] DOB: 05-14-1960  Admit date:     05/23/2023  Discharge date: 05/25/2023  Discharge Physician: Pennie Banter   PCP: [name], MD (Inactive)   Recommendations at discharge:    Follow up with Cardiology as scheduled Follow up with Primary Care in 1-2 weeks Repeat CBC, BMP at follow up  Discharge Diagnoses: Principal Problem:   Unstable angina (HCC) Active Problems:   Angina at rest [facility])   Elevated troponin   Smoker   Mixed hyperlipidemia   Essential hypertension   NSTEMI (non-ST elevated myocardial infarction) (HCC)   Abnormal nuclear stress test  Resolved Problems:   * No resolved hospital problems. *  Hospital Course:  "Gerald Garza is a 63 y.o. male with medical history significant of HTN, multiple OA status post knee and hip replacement, presented with new onset chest pains.   2 episode of chest pain Monday night and last night.  First episode happened while patient was sleeping around 11 PM on Monday night, " tightness in the chest but no chest pains" associated with shortness of breath, and patient was thinking the chest pain might come from muscle sprains and twisted and then turned several times and the pain subsided in about 30-40 minutes.  Last night, patient started to have more severe episode and woke him up during sleep, centrally located, tightness like, associated with shortness of breath but no nauseous vomiting no sweating and wife called 911 immediately.  Patient reported that his brother has early CAD in the age of 4s.  He does not smoke.   ED Course: Afebrile, nontachycardic nonhypotensive and nonhypoxic.  Chest x-ray showed no acute infiltrates.  EKG showed questionable Q waves on V1 but no previous EKG to compare with.  Troponin trending 38> 41.  And patient was started on heparin drip"   Pt was admitted to Medicine service with Cardiology consulted for further evaluation and  management.   Further hospital course and management as outlined below.   3/20 - cardiology plans for cath this afternoon.  3/21 - pt doing well, no chest pain or other issues.  Cleared by Cardiology and medically stable, requesting discharge home this afternoon.   Assessment and Plan:  Anginal-like chest pain with troponin elevation Post-cath echo normal EF. --Cardiology following --Cardiac cath 3/20 -- occluded mid-LAD lesions, overlapping DES x 2 placed --Off Heparin drip  --Aspirin, lisinopril/hydrochlorothiazide, low dose metoprolol --Cardiology to arrange outpatient follow up next week   HTN --Controlled, continue amlodipine and lisinopril   Hyperlipidemia - statin   Active smoker  - 1/2 ppd Extensive tobacco cessation counseling provided:       Consultants: cardiology Procedures performed: left heart cath with PCI/DES placement  Disposition: Home Diet recommendation:  Discharge Diet Orders (From admission, onward)     Start     Ordered   05/25/23 0000  Diet - low sodium heart healthy        05/25/23 1046            DISCHARGE MEDICATION: Allergies as of 05/25/2023       Reactions   Codeine Nausea Only   Itching also   Hydrocodone    Other Reaction(s): Not available hydrocodone        Medication List     STOP taking these medications    albuterol 108 (90 Base) MCG/ACT inhaler Commonly known as: VENTOLIN HFA   ALEVE PO       TAKE  these medications    amLODipine 5 MG tablet Commonly known as: NORVASC Take 1 tablet (5 mg total) by mouth daily. What changed:  See the new instructions. Another medication with the same name was removed. Continue taking this medication, and follow the directions you see here.   aspirin EC 81 MG tablet Take 1 tablet (81 mg total) by mouth daily. Swallow whole.   cyanocobalamin 250 MCG tablet Commonly known as: VITAMIN B12 Take 250 mcg by mouth daily.   glucosamine-chondroitin 500-400 MG tablet Take  1 tablet by mouth 2 (two) times daily.   lisinopril-hydrochlorothiazide 20-25 MG tablet Commonly known as: ZESTORETIC Take 1 tablet by mouth daily.   Magnesium 250 MG Tabs Take by mouth 2 (two) times daily.   metoprolol succinate 25 MG 24 hr tablet Commonly known as: TOPROL-XL Take 0.5 tablets (12.5 mg total) by mouth daily.   MULTI-VITAMIN DAILY PO Take by mouth.   OMEGA-3 EPA FISH OIL PO Take by mouth. 3 per day   prasugrel 10 MG Tabs tablet Commonly known as: EFFIENT Take 1 tablet (10 mg total) by mouth daily.   rosuvastatin 20 MG tablet Commonly known as: CRESTOR Take 1 tablet (20 mg total) by mouth at bedtime.        Discharge Exam: Filed Weights   05/23/23 0150 05/24/23 1433 05/24/23 2019  Weight: 104.3 kg 104.3 kg 102.5 kg   General exam: awake, alert, no acute distress HEENT: atraumatic, clear conjunctiva, anicteric sclera, moist mucus membranes, hearing grossly normal  Respiratory system: CTAB, no wheezes, rales or rhonchi, normal respiratory effort. Cardiovascular system: normal S1/S2, RRR, no JVD, murmurs, rubs, gallops, no pedal edema.   Gastrointestinal system: soft, NT, ND, no HSM felt, +bowel sounds. Central nervous system: A&O x3. no gross focal neurologic deficits, normal speech Extremities: moves all , no edema, normal tone Skin: dry, intact, normal temperature, normal color, No rashes, lesions or ulcers Psychiatry: normal mood, congruent affect, judgement and insight appear normal   Condition at discharge: stable  The results of significant diagnostics from this hospitalization (including imaging, microbiology, ancillary and laboratory) are listed below for reference.   Imaging Studies: CARDIAC CATHETERIZATION Result Date: 05/24/2023 Images from the original result were not included.   CULPRIT LESION: Mid LAD to Dist LAD lesion is 100% stenosed with 60% stenosed side branch in 3rd Sept. (appears to be subacute occlusion)   With some difficulty,  the lesion was wired, PTCA performed followed by DES PCI-this required 2 overlapping stents due to acute closure and balloon related dissection.   A drug-eluting stent was successfully placed covering the distal portion of the lesion, using a STENT ONYX FRONTIER 2.5X22.  A second drug-eluting stent was successfully placed overlapping the distal lesion and covering the more proximal segment of the lesion/dissection, using a STENT ONYX FRONTIER 2.5X22. => Stents were deployed to 2.6 mm, then the proximal 36 mm of the 42 mm were postdilated from 2.9 to 2.8 mm-then 2.6 mm distally.   Post intervention, there is a 0% residual stenosis throughout the 2 overlapping stents.. Post intervention, the side branch was reduced to 35% residual stenosis.   ----------------------------------------   2nd Diag lesion is 50% stenosed.   Prox RCA-1 lesion is 20% stenosed. Prox RCA-2 lesion is 20% stenosed.   ----------------------------------------   The left ventricular systolic function is normal. The left ventricular ejection fraction is 50-55% by visual estimate.  LV end diastolic pressure is normal.   There is no aortic valve stenosis. Diagnostic: Dominance: Right  Intervention POST-OPERATIVE DIAGNOSIS:  Severe single-vessel CAD with subacute occlusion of the mid LAD just prior to 3rd Sep & 3rd Diag branches as the likely culprit lesion; also noted 50% ostial proximal major 2nd diag branch.  The 3rd diag branch is relatively small. Successful complex/very difficult revascularization with PTCA-DES PCI of the mid LAD with 2 overlapping Xience frontier DES 2.5 mm x 22 mm stents postdilated a tapered fashion from 2.8 down to 2.6 mm.  (The initial lesion segment was felt to be 22 mm, but are not able to advance the stent due to loss of guide positioning and wire positioning, therefore I changed out the guide catheter and attempted to rewire the vessel is again occluded requiring rewiring and PTCA followed by now a significantly  longer stent due to a small area of dissection at the left Garza lesion site.) Complex intervention due to need to change guide catheter, very difficult lesion to wire with a long residual lesion with spasm and post angioplastied dissection. Otherwise mild to moderate disease in the proximal LAD and proximal RCA with large dominant RCA filling a large posterolateral system. Mildly elevated LVEDP PLAN OF CARE: Admit to inpatient the patient will be admitted overnight from the ER through our holding area to the floor.  He will be monitored overnight with titration medications and removal of TR band.  In the absence of any other complications or medical issues, we expect the patient to be ready for discharge from an interventional cardiology perspective on 05/25/2023. Start statin and adjust BP meds per rounding cardiology team Recommend uninterrupted dual antiplatelet therapy with Aspirin 81mg  daily and Prasugrel 10mg  daily for a minimum of 12 months (ACS-Class I recommendation). Based on the length of stent, after 1 year DAPT, would de-escalate to Thienopyridine monotherapy-Plavix 75 mg SAPT long-term. Bryan Lemma, MD   ECHOCARDIOGRAM COMPLETE Result Date: 05/24/2023    ECHOCARDIOGRAM REPORT   Patient Name:   Gerald Garza Date of Exam: 05/24/2023 Medical Rec #:  [id number]        Height:       72.0 in Accession #:    [id number]       Weight:       230.0 lb Date of Birth:  30-Jul-1960         BSA:          2.261 m Patient Age:    63 years         BP:           110/70 mmHg Patient Gender: M                HR:           58 bpm. Exam Location:  [facility] Procedure: 2D Echo, Cardiac Doppler, Color Doppler and Strain Analysis (Both            Spectral and Color Flow Doppler were utilized during procedure). Indications:     Chest pain R07.9  History:         Patient has no prior history of Echocardiogram examinations.                  Risk Factors:Hypertension.  Sonographer:     Cristela Blue Referring Phys:  [id number] Antonieta Iba Diagnosing Phys: Julien Nordmann MD  Sonographer Comments: Global longitudinal strain was attempted. IMPRESSIONS  1. Left ventricular ejection fraction, by estimation, is 55 to 60%. Left ventricular ejection fraction by 3D volume is 54 %. The left ventricle has  normal function. The left ventricle has no regional wall motion abnormalities. Left ventricular diastolic  parameters are consistent with Grade I diastolic dysfunction (impaired relaxation). The average left ventricular global longitudinal strain is -16.7 %. The global longitudinal strain is normal.  2. Right ventricular systolic function is normal. The right ventricular size is normal. There is normal pulmonary artery systolic pressure. The estimated right ventricular systolic pressure is 22.1 mmHg.  3. The mitral valve is normal in structure. No evidence of mitral valve regurgitation. No evidence of mitral stenosis.  4. The aortic valve is normal in structure. Aortic valve regurgitation is not visualized. No aortic stenosis is present.  5. The inferior vena cava is normal in size with greater than 50% respiratory variability, suggesting right atrial pressure of 3 mmHg. FINDINGS  Left Ventricle: Left ventricular ejection fraction, by estimation, is 55 to 60%. Left ventricular ejection fraction by 3D volume is 54 %. The left ventricle has normal function. The left ventricle has no regional wall motion abnormalities. The average left ventricular global longitudinal strain is -16.7 %. Strain was performed and the global longitudinal strain is normal. The left ventricular internal cavity size was normal in size. There is no left ventricular hypertrophy. Left ventricular diastolic parameters are consistent with Grade I diastolic dysfunction (impaired relaxation). Right Ventricle: The right ventricular size is normal. No increase in right ventricular wall thickness. Right ventricular systolic function is normal. There is normal pulmonary artery systolic  pressure. The tricuspid regurgitant velocity is 2.07 m/s, and  with an assumed right atrial pressure of 5 mmHg, the estimated right ventricular systolic pressure is 22.1 mmHg. Left Atrium: Left atrial size was normal in size. Right Atrium: Right atrial size was normal in size. Pericardium: There is no evidence of pericardial effusion. Mitral Valve: The mitral valve is normal in structure. No evidence of mitral valve regurgitation. No evidence of mitral valve stenosis. MV peak gradient, 5.0 mmHg. The mean mitral valve gradient is 2.0 mmHg. Tricuspid Valve: The tricuspid valve is normal in structure. Tricuspid valve regurgitation is not demonstrated. No evidence of tricuspid stenosis. Aortic Valve: The aortic valve is normal in structure. Aortic valve regurgitation is not visualized. No aortic stenosis is present. Aortic valve mean gradient measures 2.0 mmHg. Aortic valve peak gradient measures 4.6 mmHg. Aortic valve area, by VTI measures 3.79 cm. Pulmonic Valve: The pulmonic valve was normal in structure. Pulmonic valve regurgitation is not visualized. No evidence of pulmonic stenosis. Aorta: The aortic root is normal in size and structure. Venous: The inferior vena cava is normal in size with greater than 50% respiratory variability, suggesting right atrial pressure of 3 mmHg. IAS/Shunts: No atrial level shunt detected by color flow Doppler. Additional Comments: 3D was performed not requiring image post processing on an independent workstation and was normal.  LEFT VENTRICLE PLAX 2D LVIDd:         4.10 cm         Diastology LVIDs:         2.80 cm         LV e' medial:   5.33 cm/s LV PW:         1.20 cm         LV E/e' medial: 8.3 LV IVS:        1.60 cm LVOT diam:     2.30 cm         2D Longitudinal LV SV:         78  Strain LV SV Index:   34              2D Strain GLS   -16.7 % LVOT Area:     4.15 cm        Avg:                                 3D Volume EF                                LV 3D EF:     Left                                             ventricul                                             ar                                             ejection                                             fraction                                             by 3D                                             volume is                                             54 %.                                 3D Volume EF:                                3D EF:        54 %                                LV EDV:       224 ml                                LV ESV:       103 ml  LV SV:        120 ml RIGHT VENTRICLE RV Basal diam:  3.00 cm RV Mid diam:    1.90 cm RV S prime:     10.30 cm/s TAPSE (M-mode): 2.2 cm LEFT ATRIUM             Index        RIGHT ATRIUM           Index LA diam:        3.10 cm 1.37 cm/m   RA Area:     17.70 cm LA Vol (A2C):   42.7 ml 18.89 ml/m  RA Volume:   46.10 ml  20.39 ml/m LA Vol (A4C):   31.1 ml 13.76 ml/m LA Biplane Vol: 38.7 ml 17.12 ml/m  AORTIC VALVE AV Area (Vmax):    2.82 cm AV Area (Vmean):   3.10 cm AV Area (VTI):     3.79 cm AV Vmax:           107.00 cm/s AV Vmean:          71.800 cm/s AV VTI:            0.205 m AV Peak Grad:      4.6 mmHg AV Mean Grad:      2.0 mmHg LVOT Vmax:         72.70 cm/s LVOT Vmean:        53.600 cm/s LVOT VTI:          0.187 m LVOT/AV VTI ratio: 0.91  AORTA Ao Root diam: 3.50 cm MITRAL VALVE               TRICUSPID VALVE MV Area (PHT): 1.91 cm    TR Peak grad:   17.1 mmHg MV Area VTI:   2.70 cm    TR Vmax:        207.00 cm/s MV Peak grad:  5.0 mmHg MV Mean grad:  2.0 mmHg    SHUNTS MV Vmax:       1.12 m/s    Systemic VTI:  0.19 m MV Vmean:      57.3 cm/s   Systemic Diam: 2.30 cm MV Decel Time: 398 msec MV E velocity: 44.00 cm/s MV A velocity: 95.40 cm/s MV E/A ratio:  0.46 Julien Nordmann MD Electronically signed by Julien Nordmann MD Signature Date/Time: 05/24/2023/11:32:46 AM    Final    NM Myocar Multi W/Spect Izetta Dakin Motion /  EF Result Date: 05/23/2023 Pharmacological myocardial perfusion imaging study with large region of fixed and reversible defect in the mid to distal septal and apical region Hypokinesis in the apical, apical septal region, EF estimated at 58% No EKG changes concerning for ischemia at peak stress or in recovery. CT attenuation correction images with mild coronary calcification High risk scan Signed, Dossie Arbour, MD, Ph.D [facility]   DG Chest 2 View Result Date: 05/23/2023 CLINICAL DATA:  Chest tightness. EXAM: CHEST - 2 VIEW COMPARISON:  December 24, 2019 FINDINGS: The heart size and mediastinal contours are within normal limits. Both lungs are clear. A left shoulder replacement is noted. The visualized skeletal structures are otherwise unremarkable. IMPRESSION: No active cardiopulmonary disease. Electronically Signed   By: Aram Candela M.D.   On: 05/23/2023 02:38    Microbiology: No results found for this or any previous visit.  Labs: CBC: Recent Labs  Lab 05/23/23 0154 05/24/23 0715 05/25/23 0503  WBC 9.5 7.0 6.8  HGB 17.1* 15.4 14.9  HCT 48.6 44.2 42.4  MCV 94.2 95.5 94.0  PLT 243 191 176   Basic Metabolic Panel: Recent Labs  Lab 05/23/23 0154 05/25/23 0503  NA 139 140  K 3.6 3.5  CL 107 106  CO2 22 24  GLUCOSE 133* 98  BUN 16 19  CREATININE 0.79 0.82  CALCIUM 9.0 8.9   Liver Function Tests: No results for input(s): "AST", "ALT", "ALKPHOS", "BILITOT", "PROT", "ALBUMIN" in the last 168 hours. CBG: No results for input(s): "GLUCAP" in the last 168 hours.  Discharge time spent: greater than 30 minutes.  Signed: Pennie Banter, DO [facility] 05/25/2023

## 2023-05-25 NOTE — Plan of Care (Signed)
  Problem: Activity: Goal: Ability to tolerate increased activity will improve Outcome: Progressing   Problem: Cardiac: Goal: Ability to achieve and maintain adequate cardiovascular perfusion will improve Outcome: Progressing   Problem: Health Behavior/Discharge Planning: Goal: Ability to safely manage health-related needs after discharge will improve Outcome: Progressing   Problem: Education: Goal: Understanding of CV disease, CV risk reduction, and recovery process will improve Outcome: Progressing   Problem: Education: Goal: Knowledge of General Education information will improve Description: Including pain rating scale, medication(s)/side effects and non-pharmacologic comfort measures Outcome: Progressing

## 2023-05-25 NOTE — Plan of Care (Signed)
   Problem: Education: Goal: Understanding of cardiac disease, CV risk reduction, and recovery process will improve Outcome: Progressing Goal: Individualized Educational Video(s) Outcome: Progressing   Problem: Activity: Goal: Ability to tolerate increased activity will improve Outcome: Progressing   Problem: Cardiac: Goal: Ability to achieve and maintain adequate cardiovascular perfusion will improve Outcome: Progressing   Problem: Health Behavior/Discharge Planning: Goal: Ability to safely manage health-related needs after discharge will improve Outcome: Progressing   Problem: Education: Goal: Understanding of CV disease, CV risk reduction, and recovery process will improve Outcome: Progressing Goal: Individualized Educational Video(s) Outcome: Progressing   Problem: Activity: Goal: Ability to return to baseline activity level will improve Outcome: Progressing   Problem: Cardiovascular: Goal: Ability to achieve and maintain adequate cardiovascular perfusion will improve Outcome: Progressing Goal: Vascular access site(s) Level 0-1 will be maintained Outcome: Progressing   Problem: Health Behavior/Discharge Planning: Goal: Ability to safely manage health-related needs after discharge will improve Outcome: Progressing   Problem: Education: Goal: Knowledge of General Education information will improve Description: Including pain rating scale, medication(s)/side effects and non-pharmacologic comfort measures Outcome: Progressing   Problem: Health Behavior/Discharge Planning: Goal: Ability to manage health-related needs will improve Outcome: Progressing   Problem: Clinical Measurements: Goal: Ability to maintain clinical measurements within normal limits will improve Outcome: Progressing Goal: Will remain free from infection Outcome: Progressing Goal: Diagnostic test results will improve Outcome: Progressing Goal: Respiratory complications will improve Outcome:  Progressing Goal: Cardiovascular complication will be avoided Outcome: Progressing   Problem: Activity: Goal: Risk for activity intolerance will decrease Outcome: Progressing   Problem: Nutrition: Goal: Adequate nutrition will be maintained Outcome: Progressing   Problem: Coping: Goal: Level of anxiety will decrease Outcome: Progressing   Problem: Elimination: Goal: Will not experience complications related to bowel motility Outcome: Progressing Goal: Will not experience complications related to urinary retention Outcome: Progressing   Problem: Pain Managment: Goal: General experience of comfort will improve and/or be controlled Outcome: Progressing   Problem: Safety: Goal: Ability to remain free from injury will improve Outcome: Progressing   Problem: Skin Integrity: Goal: Risk for impaired skin integrity will decrease Outcome: Progressing

## 2023-05-26 LAB — LIPOPROTEIN A (LPA): Lipoprotein (a): 84 nmol/L — ABNORMAL HIGH (ref <75.0)

## 2023-05-30 ENCOUNTER — Ambulatory Visit: Attending: Nurse Practitioner | Admitting: Nurse Practitioner

## 2023-05-30 ENCOUNTER — Encounter: Payer: Self-pay | Admitting: Nurse Practitioner

## 2023-05-30 ENCOUNTER — Encounter: Attending: Cardiology

## 2023-05-30 VITALS — BP 130/68 | HR 68 | Ht 72.0 in | Wt 222.0 lb

## 2023-05-30 DIAGNOSIS — I214 Non-ST elevation (NSTEMI) myocardial infarction: Secondary | ICD-10-CM

## 2023-05-30 DIAGNOSIS — I2510 Atherosclerotic heart disease of native coronary artery without angina pectoris: Secondary | ICD-10-CM | POA: Diagnosis not present

## 2023-05-30 DIAGNOSIS — Z720 Tobacco use: Secondary | ICD-10-CM

## 2023-05-30 DIAGNOSIS — Z955 Presence of coronary angioplasty implant and graft: Secondary | ICD-10-CM

## 2023-05-30 DIAGNOSIS — E785 Hyperlipidemia, unspecified: Secondary | ICD-10-CM | POA: Diagnosis not present

## 2023-05-30 DIAGNOSIS — I10 Essential (primary) hypertension: Secondary | ICD-10-CM | POA: Diagnosis not present

## 2023-05-30 MED ORDER — PRASUGREL HCL 10 MG PO TABS: 10.0000 mg | ORAL_TABLET | Freq: Every day | ORAL | 3 refills | Status: AC

## 2023-05-30 MED ORDER — METOPROLOL SUCCINATE ER 25 MG PO TB24: 12.5000 mg | ORAL_TABLET | Freq: Every day | ORAL | 3 refills | Status: DC

## 2023-05-30 MED ORDER — ROSUVASTATIN CALCIUM 20 MG PO TABS: 20.0000 mg | ORAL_TABLET | Freq: Every day | ORAL | 3 refills | Status: AC

## 2023-05-30 MED ORDER — AMLODIPINE BESYLATE 5 MG PO TABS
5.0000 mg | ORAL_TABLET | Freq: Every day | ORAL | 3 refills | Status: AC
Start: 2023-05-30 — End: ?

## 2023-05-30 NOTE — Progress Notes (Signed)
 Office Visit    Patient Name: Gerald Garza Date of Encounter: 05/30/2023  Primary Care Provider:  [name], MD (Inactive) Primary Cardiologist:  Julien Nordmann, MD  Chief Complaint    63 y.o. male with a history of hypertension, hyperlipidemia, tobacco abuse, osteoarthritis, who presents for follow-up related to a recent NSTEMI hospitalization.   Past Medical History  Subjective   Past Medical History:  Diagnosis Date   Arthritis    CAD (coronary artery disease)    a. 05/2023 NSTEMI-->Myoview: high risk w/ septal/apical infarct/ischemia; b. 05/2023 Cath/PCI: LM nl, LAD 172m/d (2.5x22 Xience Frontier DES x 2), D2 50ost, LCX min irregs, RCA 20/20p-->12 mos DAPT followed by SAPT rec.   Diastolic dysfunction    a. 05/2023 Echo: EF 55-60%, no rwma, GrI DD, nl RV fxn, RVSP 22.1 mmHg.   Headache    hx cluster headaches   Hyperlipidemia LDL goal <70    Hypertension    Pneumonia 2015   bacterial   Past Surgical History:  Procedure Laterality Date   bunion surgery Left    right hammer to done also   COLONOSCOPY WITH PROPOFOL N/A 10/12/2015   Procedure: COLONOSCOPY WITH PROPOFOL;  Surgeon: Charolett Bumpers, MD;  Location: [facility];  Service: Endoscopy;  Laterality: N/A;   CORONARY STENT INTERVENTION N/A 05/24/2023   Procedure: CORONARY STENT INTERVENTION;  Surgeon: Marykay Lex, MD;  Location: [facility];  Service: Cardiovascular;  Laterality: N/A;   LEFT HEART CATH AND CORONARY ANGIOGRAPHY N/A 05/24/2023   Procedure: LEFT HEART CATH AND CORONARY ANGIOGRAPHY;  Surgeon: Marykay Lex, MD;  Location: [facility] CV [facility];  Service: Cardiovascular;  Laterality: N/A;   REPLACEMENT TOTAL KNEE Bilateral    TONSILLECTOMY  age 72   Allergies  Allergies  Allergen Reactions   Codeine Nausea Only    Itching also   Hydrocodone     Other Reaction(s): Not available  hydrocodone      History of Present Illness      63 y.o. y/o male with a history of  hypertension, hyperlipidemia, tobacco abuse, osteoarthritis, who presents for follow-up related to a recent hospitalization for nstemi and PCI of the LAD. [name] was admitted to the hospital on 05/23/23 with new onset chest pain. Chest pain was centrally located, and had a tightness associated with SOB. EKG showed questionable Q waves in V1 but there was no previous EKG to compare this with. Troponin trended 38  41. Stress test in the hospital showed septal and apical perfusion defect with some reversible ischemia. Cardiac cath was completed on 3/20  showing occluded mid-LAD lesions with overlapping DES x 2 placed in the mid LAD. Post-cath Echo showed and EF of 55-60%. He was d/c'd home on post-procedure day 1 with recommendation for DAPT (asa/prasugrel) x 12 mos followed by thienopyridine monotherapy long term.   Since his hospitalization last week, [name] has been doing very well. He denies palpitations, PND, orthopnea, dizziness, syncope, edema, or early satiety.  He is eager to get his lifestyle better to help prevent another cardiac event.  He was smoking 1/2 ppd prior to hospitalization.  He signed up for cardiac rehabilitation this morning.   Objective  Home Medications    Current Outpatient Medications  Medication Sig Dispense Refill   aspirin EC 81 MG tablet Take 1 tablet (81 mg total) by mouth daily. Swallow whole. 30 tablet 12   glucosamine-chondroitin 500-400 MG tablet Take 1 tablet by mouth 2 (two) times  daily.     lisinopril-hydrochlorothiazide (PRINZIDE,ZESTORETIC) 20-25 MG tablet Take 1 tablet by mouth daily.     Magnesium 250 MG TABS Take by mouth 2 (two) times daily.     Multiple Vitamin (MULTI-VITAMIN DAILY PO) Take by mouth.     Omega-3 Fatty Acids (OMEGA-3 EPA FISH OIL PO) Take by mouth. 3 per day     vitamin B-12 (CYANOCOBALAMIN) 250 MCG tablet Take 250 mcg by mouth daily.     amLODipine (NORVASC) 5 MG tablet Take 1 tablet (5 mg total) by mouth daily. 90 tablet 3    metoprolol succinate (TOPROL-XL) 25 MG 24 hr tablet Take 0.5 tablets (12.5 mg total) by mouth daily. 30 tablet 3   prasugrel (EFFIENT) 10 MG TABS tablet Take 1 tablet (10 mg total) by mouth daily. 90 tablet 3   rosuvastatin (CRESTOR) 20 MG tablet Take 1 tablet (20 mg total) by mouth at bedtime. 90 tablet 3   No current facility-administered medications for this visit.     Physical Exam    VS:  BP 130/68   Pulse 68   Ht 6' (1.829 m)   Wt 222 lb (100.7 kg)   SpO2 97%   BMI 30.11 kg/m  , BMI Body mass index is 30.11 kg/m.     GEN: Well nourished, well developed, in no acute distress. HEENT: normal. Neck: Supple, no JVD, carotid bruits, or masses. Cardiac: RRR, no murmurs, rubs, or gallops. No clubbing, cyanosis, edema.  Radials 2+/PT 2+ and equal bilaterally.  R radial cath site w/o bleeding/bruit/hematoma.  Respiratory:  Respirations regular and unlabored, clear to auscultation bilaterally. GI: Soft, nontender, nondistended, BS + x 4. MS: no deformity or atrophy. Skin: warm and dry, no rash. Neuro:  Strength and sensation are intact. Psych: Normal affect.  Accessory Clinical Findings   Lab Results  Component Value Date   WBC 6.8 05/25/2023   HGB 14.9 05/25/2023   HCT 42.4 05/25/2023   MCV 94.0 05/25/2023   PLT 176 05/25/2023   Lab Results  Component Value Date   CREATININE 0.82 05/25/2023   BUN 19 05/25/2023   NA 140 05/25/2023   K 3.5 05/25/2023   CL 106 05/25/2023   CO2 24 05/25/2023   Lab Results  Component Value Date   CHOL 200 05/23/2023   HDL 41 05/23/2023   LDLCALC 85 05/23/2023   TRIG 368 (H) 05/23/2023   CHOLHDL 4.9 05/23/2023    Lab Results  Component Value Date   TSH 1.022 05/23/2023       Assessment & Plan    1. NSTEMI/CAD: [name] was admitted to the hospital on 05/23/23 with new onset chest pain with mild troponin elevation. Stress test in the hospital was abnormal leading to a cardiac catheterization. Cath was completed on 3/20 showing  occluded mid-LAD lesions with overlapping DES x 2 placed in the mid LAD. Post procedure EF 55-60% by echo. He denies having any chest pain, or dyspnea since the discharge and his R radial site is healing well. He is looking forward to start cardiac rehab soon.  Continue Aspirin, Lisinopril, Metoprolol, Effient, and Crestor. Continue DAPT x 12 mos w/ plan for thienopyridine monotherapy there after.  2. Primary HTN: Patient in office BP 130/68. He takes his blood pressure at home and it runs in the 120s/130s. Stable on ? blocker, ccb, ACEi/hydrochlorothiazide.   3. Hyperlipidemia: LDL 85 during hospitalization - statin naive at that time. Continue Rosuvasatin. Plan to f/u lipids/lfts @ f/u in ~ 4  wks.  4. Tobacco Abuse: Patient still smokes daily. Cessation advised.  5. Disposition: Follow-up in 1 month or sooner if necessary.  F/u lipids/lfts @ f/u office visit.  Nicolasa Ducking, NP 05/30/2023, 5:11 PM

## 2023-05-30 NOTE — Progress Notes (Signed)
 Virtual Visit completed. Patient informed on EP and RD appointment and 6 Minute walk test. Patient also informed of patient health questionnaires on My Chart. Patient Verbalizes understanding. Visit diagnosis can be found in [facility] 05/23/2023.

## 2023-05-30 NOTE — Patient Instructions (Addendum)
 Medication Instructions:  Your physician recommends that you continue on your current medications as directed. Please refer to the Current Medication list given to you today.  Refills on your cardiac medications have been sent to the pahrmacy   *If you need a refill on your cardiac medications before your next appointment, please call your pharmacy*   Lab Work: No labs ordered today  If you have labs (blood work) drawn today and your tests are completely normal, you will receive your results only by: MyChart Message (if you have MyChart) OR A paper copy in the mail If you have any lab test that is abnormal or we need to change your treatment, we will call you to review the results.   Testing/Procedures: No test ordered today    Follow-Up: At [facility], you and your health needs are our priority.  As part of our continuing mission to provide you with exceptional heart care, we have created designated Provider Care Teams.  These Care Teams include your primary Cardiologist (physician) and Advanced Practice Providers (APPs -  Physician Assistants and Nurse Practitioners) who all work together to provide you with the care you need, when you need it.  We recommend signing up for the patient portal called "MyChart".  Sign up information is provided on this After Visit Summary.  MyChart is used to connect with patients for Virtual Visits (Telemedicine).  Patients are able to view lab/test results, encounter notes, upcoming appointments, etc.  Non-urgent messages can be sent to your provider as well.   To learn more about what you can do with MyChart, go to [email or web address].    Your next appointment:   1 month(s)  Provider:   You may see Julien Nordmann, MD or one of the following Advanced Practice Providers on your designated Care Team:   Nicolasa Ducking, NP Eula Listen, PA-C Cadence Fransico Michael, PA-C Charlsie Quest, NP Carlos Levering, NP    Other Instructions

## 2023-06-04 DIAGNOSIS — E782 Mixed hyperlipidemia: Secondary | ICD-10-CM | POA: Diagnosis not present

## 2023-06-04 DIAGNOSIS — E7841 Elevated Lipoprotein(a): Secondary | ICD-10-CM | POA: Diagnosis not present

## 2023-06-04 DIAGNOSIS — I10 Essential (primary) hypertension: Secondary | ICD-10-CM | POA: Diagnosis not present

## 2023-06-04 DIAGNOSIS — I2510 Atherosclerotic heart disease of native coronary artery without angina pectoris: Secondary | ICD-10-CM | POA: Diagnosis not present

## 2023-06-06 ENCOUNTER — Encounter: Attending: Cardiology | Admitting: *Deleted

## 2023-06-06 VITALS — Ht 71.5 in | Wt 228.1 lb

## 2023-06-06 DIAGNOSIS — Z955 Presence of coronary angioplasty implant and graft: Secondary | ICD-10-CM | POA: Insufficient documentation

## 2023-06-06 DIAGNOSIS — I214 Non-ST elevation (NSTEMI) myocardial infarction: Secondary | ICD-10-CM | POA: Diagnosis not present

## 2023-06-06 NOTE — Patient Instructions (Signed)
 Patient Instructions  Patient Details  Name: Gerald Garza MRN: [id number] Date of Birth: 01-20-1961 Referring Provider:  Marykay Lex, MD  Below are your personal goals for exercise, nutrition, and risk factors. Our goal is to help you stay on track towards obtaining and maintaining these goals. We will be discussing your progress on these goals with you throughout the program.  Initial Exercise Prescription:  Initial Exercise Prescription - 06/06/23 1200       Date of Initial Exercise RX and Referring Provider   Date 06/06/23    Referring Provider Dr. Bryan Lemma      Oxygen   Maintain Oxygen Saturation 88% or higher      Treadmill   MPH 2    Grade 1    Minutes 15    METs 2.81      REL-XR   Level 2    Speed 50    Minutes 15    METs 2.76      T5 Nustep   Level 2    SPM 80    Minutes 15    METs 2.76      Prescription Details   Frequency (times per week) 3    Duration Progress to 30 minutes of continuous aerobic without signs/symptoms of physical distress      Intensity   THRR 40-80% of Max Heartrate 105-138    Ratings of Perceived Exertion 11-13    Perceived Dyspnea 0-4      Progression   Progression Continue to progress workloads to maintain intensity without signs/symptoms of physical distress.      Resistance Training   Training Prescription Yes    Weight 5    Reps 10-15             Exercise Goals: Frequency: Be able to perform aerobic exercise two to three times per week in program working toward 2-5 days per week of home exercise.  Intensity: Work with a perceived exertion of 11 (fairly light) - 15 (hard) while following your exercise prescription.  We will make changes to your prescription with you as you progress through the program.   Duration: Be able to do 30 to 45 minutes of continuous aerobic exercise in addition to a 5 minute warm-up and a 5 minute cool-down routine.   Nutrition Goals: Your personal nutrition goals will be  established when you do your nutrition analysis with the dietician.  The following are general nutrition guidelines to follow: Cholesterol < 200mg /day Sodium < 1500mg /day Fiber: Men over 50 yrs - 30 grams per day  Personal Goals:  Personal Goals and Risk Factors at Admission - 06/06/23 1212       Core Components/Risk Factors/Patient Goals on Admission   Intervention Weight Management: Develop a combined nutrition and exercise program designed to reach desired caloric intake, while maintaining appropriate intake of nutrient and fiber, sodium and fats, and appropriate energy expenditure required for the weight goal.;Weight Management: Provide education and appropriate resources to help participant work on and attain dietary goals.;Weight Management/Obesity: Establish reasonable short term and long term weight goals.;Obesity: Provide education and appropriate resources to help participant work on and attain dietary goals.    Expected Outcomes Short Term: Continue to assess and modify interventions until short term weight is achieved;Weight Loss: Understanding of general recommendations for a balanced deficit meal plan, which promotes 1-2 lb weight loss per week and includes a negative energy balance of [phone] kcal/d;Understanding recommendations for meals to include 15-35% energy as protein, 25-35% energy  from fat, 35-60% energy from carbohydrates, less than 200mg  of dietary cholesterol, 20-35 gm of total fiber daily;Understanding of distribution of calorie intake throughout the day with the consumption of 4-5 meals/snacks;Long Term: Adherence to nutrition and physical activity/exercise program aimed toward attainment of established weight goal    Tobacco Cessation Yes    Number of packs per day .2    Intervention Assist the participant in steps to quit. Provide individualized education and counseling about committing to Tobacco Cessation, relapse prevention, and pharmacological support that can be  provided by physician.;Education officer, environmental, assist with locating and accessing local/national Quit Smoking programs, and support quit date choice.    Expected Outcomes Short Term: Will demonstrate readiness to quit, by selecting a quit date.;Short Term: Will quit all tobacco product use, adhering to prevention of relapse plan.;Long Term: Complete abstinence from all tobacco products for at least 12 months from quit date.    Hypertension Yes    Intervention Provide education on lifestyle modifcations including regular physical activity/exercise, weight management, moderate sodium restriction and increased consumption of fresh fruit, vegetables, and low fat dairy, alcohol moderation, and smoking cessation.;Monitor prescription use compliance.    Expected Outcomes Short Term: Continued assessment and intervention until BP is < 140/46mm HG in hypertensive participants. < 130/52mm HG in hypertensive participants with diabetes, heart failure or chronic kidney disease.;Long Term: Maintenance of blood pressure at goal levels.    Lipids Yes    Intervention Provide education and support for participant on nutrition & aerobic/resistive exercise along with prescribed medications to achieve LDL 70mg , HDL >40mg .    Expected Outcomes Short Term: Participant states understanding of desired cholesterol values and is compliant with medications prescribed. Participant is following exercise prescription and nutrition guidelines.;Long Term: Cholesterol controlled with medications as prescribed, with individualized exercise RX and with personalized nutrition plan. Value goals: LDL < 70mg , HDL > 40 mg.             Tobacco Use Initial Evaluation: Social History   Tobacco Use  Smoking Status Every Day   Current packs/day: 0.50   Average packs/day: 0.5 packs/day for 40.0 years (20.0 ttl pk-yrs)   Types: Cigarettes  Smokeless Tobacco Never    Exercise Goals and Review:  Exercise Goals     Row Name  06/06/23 1210             Exercise Goals   Increase Physical Activity Yes       Intervention Provide advice, education, support and counseling about physical activity/exercise needs.;Develop an individualized exercise prescription for aerobic and resistive training based on initial evaluation findings, risk stratification, comorbidities and participant's personal goals.       Expected Outcomes Short Term: Attend rehab on a regular basis to increase amount of physical activity.;Long Term: Add in home exercise to make exercise part of routine and to increase amount of physical activity.;Long Term: Exercising regularly at least 3-5 days a week.       Increase Strength and Stamina Yes       Intervention Provide advice, education, support and counseling about physical activity/exercise needs.;Develop an individualized exercise prescription for aerobic and resistive training based on initial evaluation findings, risk stratification, comorbidities and participant's personal goals.       Expected Outcomes Short Term: Increase workloads from initial exercise prescription for resistance, speed, and METs.;Short Term: Perform resistance training exercises routinely during rehab and add in resistance training at home;Long Term: Improve cardiorespiratory fitness, muscular endurance and strength as measured by increased METs and  functional capacity ( )       Able to understand and use rate of perceived exertion (RPE) scale Yes       Intervention Provide education and explanation on how to use RPE scale       Expected Outcomes Short Term: Able to use RPE daily in rehab to express subjective intensity level;Long Term:  Able to use RPE to guide intensity level when exercising independently       Able to understand and use Dyspnea scale Yes       Intervention Provide education and explanation on how to use Dyspnea scale       Expected Outcomes Short Term: Able to use Dyspnea scale daily in rehab to express  subjective sense of shortness of breath during exertion;Long Term: Able to use Dyspnea scale to guide intensity level when exercising independently       Knowledge and understanding of Target Heart Rate Range (THRR) Yes       Intervention Provide education and explanation of THRR including how the numbers were predicted and where they are located for reference       Expected Outcomes Short Term: Able to state/look up THRR;Long Term: Able to use THRR to govern intensity when exercising independently;Short Term: Able to use daily as guideline for intensity in rehab       Able to check pulse independently Yes       Intervention Provide education and demonstration on how to check pulse in carotid and radial arteries.;Review the importance of being able to check your own pulse for safety during independent exercise       Expected Outcomes Short Term: Able to explain why pulse checking is important during independent exercise;Long Term: Able to check pulse independently and accurately       Understanding of Exercise Prescription Yes       Intervention Provide education, explanation, and written materials on patient's individual exercise prescription       Expected Outcomes Short Term: Able to explain program exercise prescription;Long Term: Able to explain home exercise prescription to exercise independently                Copy of goals given to participant.

## 2023-06-06 NOTE — Progress Notes (Signed)
 Cardiac Individual Treatment Plan  Patient Details  Name: Gerald Garza MRN: [id number] Date of Birth: Mar 11, 1960 Referring Provider:   Flowsheet Row Cardiac Rehab from 06/06/2023 in [facility] Cardiac and Pulmonary Rehab  Referring Provider Dr. Bryan Lemma       Initial Encounter Date:  Flowsheet Row Cardiac Rehab from 06/06/2023 in [facility] Cardiac and Pulmonary Rehab  Date 06/06/23       Visit Diagnosis: NSTEMI (non-ST elevated myocardial infarction) Gerald Garza)  Status post coronary artery stent placement  Patient's Home Medications on Admission:  Current Outpatient Medications:    amLODipine (NORVASC) 5 MG tablet, Take 1 tablet (5 mg total) by mouth daily., Disp: 90 tablet, Rfl: 3   aspirin EC 81 MG tablet, Take 1 tablet (81 mg total) by mouth daily. Swallow whole., Disp: 30 tablet, Rfl: 12   glucosamine-chondroitin 500-400 MG tablet, Take 1 tablet by mouth 2 (two) times daily., Disp: , Rfl:    lisinopril-hydrochlorothiazide (PRINZIDE,ZESTORETIC) 20-25 MG tablet, Take 1 tablet by mouth daily., Disp: , Rfl:    Magnesium 250 MG TABS, Take by mouth 2 (two) times daily., Disp: , Rfl:    metoprolol succinate (TOPROL-XL) 25 MG 24 hr tablet, Take 0.5 tablets (12.5 mg total) by mouth daily., Disp: 30 tablet, Rfl: 3   Multiple Vitamin (MULTI-VITAMIN DAILY PO), Take by mouth., Disp: , Rfl:    Omega-3 Fatty Acids (OMEGA-3 EPA FISH OIL PO), Take by mouth. 3 per day, Disp: , Rfl:    prasugrel (EFFIENT) 10 MG TABS tablet, Take 1 tablet (10 mg total) by mouth daily., Disp: 90 tablet, Rfl: 3   rosuvastatin (CRESTOR) 20 MG tablet, Take 1 tablet (20 mg total) by mouth at bedtime., Disp: 90 tablet, Rfl: 3   vitamin B-12 (CYANOCOBALAMIN) 250 MCG tablet, Take 250 mcg by mouth daily., Disp: , Rfl:   Past Medical History: Past Medical History:  Diagnosis Date   Arthritis    CAD (coronary artery disease)    a. 05/2023 NSTEMI-->Myoview: high risk w/ septal/apical infarct/ischemia; b. 05/2023 Cath/PCI: LM nl, LAD  158m/d (2.5x22 Xience Frontier DES x 2), D2 50ost, LCX min irregs, RCA 20/20p-->12 mos DAPT followed by SAPT rec.   Diastolic dysfunction    a. 05/2023 Echo: EF 55-60%, no rwma, GrI DD, nl RV fxn, RVSP 22.1 mmHg.   Headache    hx cluster headaches   Hyperlipidemia LDL goal <70    Hypertension    Pneumonia 2015   bacterial    Tobacco Use: Social History   Tobacco Use  Smoking Status Every Day   Current packs/day: 0.50   Average packs/day: 0.5 packs/day for 40.0 years (20.0 ttl pk-yrs)   Types: Cigarettes  Smokeless Tobacco Never    Labs: Review Flowsheet       Latest Ref Rng & Units 05/23/2023  Labs for ITP Cardiac and Pulmonary Rehab  Cholestrol 0 - 200 mg/dL 409   LDL (calc) 0 - 99 mg/dL 85   HDL-C >81 mg/dL 41   Trlycerides <191 mg/dL 478      Exercise Target Goals: Exercise Program Goal: Individual exercise prescription set using results from initial 6 min walk test and THRR while considering  patient's activity barriers and safety.   Exercise Prescription Goal: Initial exercise prescription builds to 30-45 minutes a day of aerobic activity, 2-3 days per week.  Home exercise guidelines will be given to patient during program as part of exercise prescription that the participant will acknowledge.   Education: Aerobic Exercise: - Group verbal and  visual presentation on the components of exercise prescription. Introduces F.I.T.T principle from ACSM for exercise prescriptions.  Reviews F.I.T.T. principles of aerobic exercise including progression. Written material given at graduation.   Education: Resistance Exercise: - Group verbal and visual presentation on the components of exercise prescription. Introduces F.I.T.T principle from ACSM for exercise prescriptions  Reviews F.I.T.T. principles of resistance exercise including progression. Written material given at graduation.    Education: Exercise & Equipment Safety: - Individual verbal instruction and demonstration  of equipment use and safety with use of the equipment. Flowsheet Row Cardiac Rehab from 06/06/2023 in [facility] Cardiac and Pulmonary Rehab  Date 06/06/23  Educator [facility]  Instruction Review Code 1- Verbalizes Understanding       Education: Exercise Physiology & General Exercise Guidelines: - Group verbal and written instruction with models to review the exercise physiology of the cardiovascular system and associated critical values. Provides general exercise guidelines with specific guidelines to those with heart or lung disease.    Education: Flexibility, Balance, Mind/Body Relaxation: - Group verbal and visual presentation with interactive activity on the components of exercise prescription. Introduces F.I.T.T principle from ACSM for exercise prescriptions. Reviews F.I.T.T. principles of flexibility and balance exercise training including progression. Also discusses the mind body connection.  Reviews various relaxation techniques to help reduce and manage stress (i.e. Deep breathing, progressive muscle relaxation, and visualization). Balance handout provided to take home. Written material given at graduation.   Activity Barriers & Risk Stratification:  Activity Barriers & Cardiac Risk Stratification - 06/06/23 1205       Activity Barriers & Cardiac Risk Stratification   Activity Barriers Left Knee Replacement;Right Knee Replacement;Other (comment)    Comments Left shoulder replacement. No issues with shoulder replacement. No pain with knee replacements but does have some limitied ROM    Cardiac Risk Stratification Moderate             6 Minute Walk:  6 Minute Walk     Row Name 06/06/23 1202         6 Minute Walk   Phase Initial     Distance 1160 feet     Walk Time 6 minutes     # of Rest Breaks 0     MPH 2.2     METS 2.76     RPE 7     Perceived Dyspnea  0     VO2 Peak 9.66     Symptoms No     Resting HR 73 bpm     Resting BP 130/80     Resting Oxygen Saturation  96 %      Exercise Oxygen Saturation  during 6 min walk 96 %     Max Ex. HR 89 bpm     Max Ex. BP 150/80     2 Minute Post BP 124/80              Oxygen Initial Assessment:   Oxygen Re-Evaluation:   Oxygen Discharge (Final Oxygen Re-Evaluation):   Initial Exercise Prescription:  Initial Exercise Prescription - 06/06/23 1200       Date of Initial Exercise RX and Referring Provider   Date 06/06/23    Referring Provider Dr. Bryan Lemma      Oxygen   Maintain Oxygen Saturation 88% or higher      Treadmill   MPH 2    Grade 1    Minutes 15    METs 2.81      REL-XR   Level 2  Speed 50    Minutes 15    METs 2.76      T5 Nustep   Level 2    SPM 80    Minutes 15    METs 2.76      Prescription Details   Frequency (times per week) 3    Duration Progress to 30 minutes of continuous aerobic without signs/symptoms of physical distress      Intensity   THRR 40-80% of Max Heartrate 105-138    Ratings of Perceived Exertion 11-13    Perceived Dyspnea 0-4      Progression   Progression Continue to progress workloads to maintain intensity without signs/symptoms of physical distress.      Resistance Training   Training Prescription Yes    Weight 5    Reps 10-15             Perform Capillary Blood Glucose checks as needed.  Exercise Prescription Changes:   Exercise Prescription Changes     Row Name 06/06/23 1200             Response to Exercise   Blood Pressure (Admit) 130/80       Blood Pressure (Exercise) 150/80       Blood Pressure (Exit) 124/80       Heart Rate (Admit) 73 bpm       Heart Rate (Exercise) 89 bpm       Heart Rate (Exit) 75 bpm       Oxygen Saturation (Admit) 96 %       Oxygen Saturation (Exercise) 96 %       Oxygen Saturation (Exit) 97 %       Rating of Perceived Exertion (Exercise) 7       Perceived Dyspnea (Exercise) 0       Symptoms none       Comments 6 MWT results                Exercise Comments:   Exercise  Goals and Review:   Exercise Goals     Row Name 06/06/23 1210             Exercise Goals   Increase Physical Activity Yes       Intervention Provide advice, education, support and counseling about physical activity/exercise needs.;Develop an individualized exercise prescription for aerobic and resistive training based on initial evaluation findings, risk stratification, comorbidities and participant's personal goals.       Expected Outcomes Short Term: Attend rehab on a regular basis to increase amount of physical activity.;Long Term: Add in home exercise to make exercise part of routine and to increase amount of physical activity.;Long Term: Exercising regularly at least 3-5 days a week.       Increase Strength and Stamina Yes       Intervention Provide advice, education, support and counseling about physical activity/exercise needs.;Develop an individualized exercise prescription for aerobic and resistive training based on initial evaluation findings, risk stratification, comorbidities and participant's personal goals.       Expected Outcomes Short Term: Increase workloads from initial exercise prescription for resistance, speed, and METs.;Short Term: Perform resistance training exercises routinely during rehab and add in resistance training at home;Long Term: Improve cardiorespiratory fitness, muscular endurance and strength as measured by increased METs and functional capacity ( )       Able to understand and use rate of perceived exertion (RPE) scale Yes       Intervention Provide education and explanation on how to use  RPE scale       Expected Outcomes Short Term: Able to use RPE daily in rehab to express subjective intensity level;Long Term:  Able to use RPE to guide intensity level when exercising independently       Able to understand and use Dyspnea scale Yes       Intervention Provide education and explanation on how to use Dyspnea scale       Expected Outcomes Short Term: Able to  use Dyspnea scale daily in rehab to express subjective sense of shortness of breath during exertion;Long Term: Able to use Dyspnea scale to guide intensity level when exercising independently       Knowledge and understanding of Target Heart Rate Range (THRR) Yes       Intervention Provide education and explanation of THRR including how the numbers were predicted and where they are located for reference       Expected Outcomes Short Term: Able to state/look up THRR;Long Term: Able to use THRR to govern intensity when exercising independently;Short Term: Able to use daily as guideline for intensity in rehab       Able to check pulse independently Yes       Intervention Provide education and demonstration on how to check pulse in carotid and radial arteries.;Review the importance of being able to check your own pulse for safety during independent exercise       Expected Outcomes Short Term: Able to explain why pulse checking is important during independent exercise;Long Term: Able to check pulse independently and accurately       Understanding of Exercise Prescription Yes       Intervention Provide education, explanation, and written materials on patient's individual exercise prescription       Expected Outcomes Short Term: Able to explain program exercise prescription;Long Term: Able to explain home exercise prescription to exercise independently                Exercise Goals Re-Evaluation :   Discharge Exercise Prescription (Final Exercise Prescription Changes):  Exercise Prescription Changes - 06/06/23 1200       Response to Exercise   Blood Pressure (Admit) 130/80    Blood Pressure (Exercise) 150/80    Blood Pressure (Exit) 124/80    Heart Rate (Admit) 73 bpm    Heart Rate (Exercise) 89 bpm    Heart Rate (Exit) 75 bpm    Oxygen Saturation (Admit) 96 %    Oxygen Saturation (Exercise) 96 %    Oxygen Saturation (Exit) 97 %    Rating of Perceived Exertion (Exercise) 7    Perceived  Dyspnea (Exercise) 0    Symptoms none    Comments 6 MWT results             Nutrition:  Target Goals: Understanding of nutrition guidelines, daily intake of sodium 1500mg , cholesterol 200mg , calories 30% from fat and 7% or less from saturated fats, daily to have 5 or more servings of fruits and vegetables.  Education: All About Nutrition: -Group instruction provided by verbal, written material, interactive activities, discussions, models, and posters to present general guidelines for heart healthy nutrition including fat, fiber, MyPlate, the role of sodium in heart healthy nutrition, utilization of the nutrition label, and utilization of this knowledge for meal planning. Follow up email sent as well. Written material given at graduation.   Biometrics:  Pre Biometrics - 06/06/23 1211       Pre Biometrics   Height 5' 11.5" (1.816 m)  Weight 228 lb 1.6 oz (103.5 kg)    Waist Circumference 44 inches    Hip Circumference 44.5 inches    Waist to Hip Ratio 0.99 %    BMI (Calculated) 31.37    Single Leg Stand 15.68 seconds              Nutrition Therapy Plan and Nutrition Goals:  Nutrition Therapy & Goals - 06/06/23 1214       Intervention Plan   Intervention Prescribe, educate and counsel regarding individualized specific dietary modifications aiming towards targeted core components such as weight, hypertension, lipid management, diabetes, heart failure and other comorbidities.    Expected Outcomes Short Term Goal: Understand basic principles of dietary content, such as calories, fat, sodium, cholesterol and nutrients.;Short Term Goal: A plan has been developed with personal nutrition goals set during dietitian appointment.;Long Term Goal: Adherence to prescribed nutrition plan.             Nutrition Assessments:  MEDIFICTS Score Key: >=70 Need to make dietary changes  40-70 Heart Healthy Diet <= 40 Therapeutic Level Cholesterol Diet   Picture Your Plate  Scores: <21 Unhealthy dietary pattern with much room for improvement. 41-50 Dietary pattern unlikely to meet recommendations for good health and room for improvement. 51-60 More healthful dietary pattern, with some room for improvement.  >60 Healthy dietary pattern, although there may be some specific behaviors that could be improved.    Nutrition Goals Re-Evaluation:   Nutrition Goals Discharge (Final Nutrition Goals Re-Evaluation):   Psychosocial: Target Goals: Acknowledge presence or absence of significant depression and/or stress, maximize coping skills, provide positive support system. Participant is able to verbalize types and ability to use techniques and skills needed for reducing stress and depression.   Education: Stress, Anxiety, and Depression - Group verbal and visual presentation to define topics covered.  Reviews how body is impacted by stress, anxiety, and depression.  Also discusses healthy ways to reduce stress and to treat/manage anxiety and depression.  Written material given at graduation.   Education: Sleep Hygiene -Provides group verbal and written instruction about how sleep can affect your health.  Define sleep hygiene, discuss sleep cycles and impact of sleep habits. Review good sleep hygiene tips.    Initial Review & Psychosocial Screening:  Initial Psych Review & Screening - 05/30/23 1346       Initial Review   Current issues with None Identified      Family Dynamics   Good Support System? Yes    Comments Gerald Garza has his wife and 4 dogs to look to for support. He does not take anything for his mood and states no mental instability.      Barriers   Psychosocial barriers to participate in program There are no identifiable barriers or psychosocial needs.;The patient should benefit from training in stress management and relaxation.      Screening Interventions   Interventions Encouraged to exercise;To provide support and resources with identified  psychosocial needs;Provide feedback about the scores to participant    Expected Outcomes Short Term goal: Utilizing psychosocial counselor, staff and physician to assist with identification of specific Stressors or current issues interfering with healing process. Setting desired goal for each stressor or current issue identified.;Long Term Goal: Stressors or current issues are controlled or eliminated.;Short Term goal: Identification and review with participant of any Quality of Life or Depression concerns found by scoring the questionnaire.;Long Term goal: The participant improves quality of Life and PHQ9 Scores as seen by post scores and/or  verbalization of changes             Quality of Life Scores:   Scores of 19 and below usually indicate a poorer quality of life in these areas.  A difference of  2-3 points is a clinically meaningful difference.  A difference of 2-3 points in the total score of the Quality of Life Index has been associated with significant improvement in overall quality of life, self-image, physical symptoms, and general health in studies assessing change in quality of life.  PHQ-9: Review Flowsheet       06/06/2023  Depression screen PHQ 2/9  Decreased Interest 0  Down, Depressed, Hopeless 0  PHQ - 2 Score 0  Altered sleeping 0  Tired, decreased energy 1  Change in appetite 0  Feeling bad or failure about yourself  0  Trouble concentrating 0  Moving slowly or fidgety/restless 0  Suicidal thoughts 0  PHQ-9 Score 1  Difficult doing work/chores Not difficult at all   Interpretation of Total Score  Total Score Depression Severity:  1-4 = Minimal depression, 5-9 = Mild depression, 10-14 = Moderate depression, 15-19 = Moderately severe depression, 20-27 = Severe depression   Psychosocial Evaluation and Intervention:  Psychosocial Evaluation - 05/30/23 1348       Psychosocial Evaluation & Interventions   Interventions Encouraged to exercise with the program and  follow exercise prescription;Relaxation education;Stress management education    Comments Gerald Garza has his wife and 4 dogs to look to for support. He does not take anything for his mood and states no mental instability.    Expected Outcomes Short: Start HeartTrack to help with mood. Long: Maintain a healthy mental state    Continue Psychosocial Garza  Follow up required by staff             Psychosocial Re-Evaluation:   Psychosocial Discharge (Final Psychosocial Re-Evaluation):   Vocational Rehabilitation: Provide vocational rehab assistance to qualifying candidates.   Vocational Rehab Evaluation & Intervention:   Education: Education Goals: Education classes will be provided on a variety of topics geared toward better understanding of heart health and risk factor modification. Participant will state understanding/return demonstration of topics presented as noted by education test scores.  Learning Barriers/Preferences:  Learning Barriers/Preferences - 05/30/23 1344       Learning Barriers/Preferences   Learning Barriers None    Learning Preferences None             General Cardiac Education Topics:  AED/CPR: - Group verbal and written instruction with the use of models to demonstrate the basic use of the AED with the basic ABC's of resuscitation.   Anatomy and Cardiac Procedures: - Group verbal and visual presentation and models provide information about basic cardiac anatomy and function. Reviews the testing methods done to diagnose heart disease and the outcomes of the test results. Describes the treatment choices: Medical Management, Angioplasty, or Coronary Bypass Surgery for treating various heart conditions including Myocardial Infarction, Angina, Valve Disease, and Cardiac Arrhythmias.  Written material given at graduation.   Medication Safety: - Group verbal and visual instruction to review commonly prescribed medications for heart and lung disease. Reviews  the medication, class of the drug, and side effects. Includes the steps to properly store meds and maintain the prescription regimen.  Written material given at graduation.   Intimacy: - Group verbal instruction through game format to discuss how heart and lung disease can affect sexual intimacy. Written material given at graduation..   Know Your Numbers and  Heart Failure: - Group verbal and visual instruction to discuss disease risk factors for cardiac and pulmonary disease and treatment options.  Reviews associated critical values for Overweight/Obesity, Hypertension, Cholesterol, and Diabetes.  Discusses basics of heart failure: signs/symptoms and treatments.  Introduces Heart Failure Zone chart for action plan for heart failure.  Written material given at graduation.   Infection Prevention: - Provides verbal and written material to individual with discussion of infection control including proper hand washing and proper equipment cleaning during exercise session. Flowsheet Row Cardiac Rehab from 06/06/2023 in [facility] Cardiac and Pulmonary Rehab  Date 06/06/23  Educator [facility]  Instruction Review Code 1- Verbalizes Understanding       Falls Prevention: - Provides verbal and written material to individual with discussion of falls prevention and safety. Flowsheet Row Cardiac Rehab from 06/06/2023 in [facility] Cardiac and Pulmonary Rehab  Date 06/06/23  Educator [facility]  Instruction Review Code 1- Verbalizes Understanding       Other: -Provides group and verbal instruction on various topics (see comments)   Knowledge Questionnaire Score:   Core Components/Risk Factors/Patient Goals at Admission:  Personal Goals and Risk Factors at Admission - 06/06/23 1212       Core Components/Risk Factors/Patient Goals on Admission   Intervention Weight Management: Develop a combined nutrition and exercise program designed to reach desired caloric intake, while maintaining appropriate intake of nutrient and  fiber, sodium and fats, and appropriate energy expenditure required for the weight goal.;Weight Management: Provide education and appropriate resources to help participant work on and attain dietary goals.;Weight Management/Obesity: Establish reasonable short term and long term weight goals.;Obesity: Provide education and appropriate resources to help participant work on and attain dietary goals.    Expected Outcomes Short Term: Continue to assess and modify interventions until short term weight is achieved;Weight Loss: Understanding of general recommendations for a balanced deficit meal plan, which promotes 1-2 lb weight loss per week and includes a negative energy balance of [phone] kcal/d;Understanding recommendations for meals to include 15-35% energy as protein, 25-35% energy from fat, 35-60% energy from carbohydrates, less than 200mg  of dietary cholesterol, 20-35 gm of total fiber daily;Understanding of distribution of calorie intake throughout the day with the consumption of 4-5 meals/snacks;Long Term: Adherence to nutrition and physical activity/exercise program aimed toward attainment of established weight goal    Tobacco Cessation Yes    Number of packs per day .2    Intervention Assist the participant in steps to quit. Provide individualized education and counseling about committing to Tobacco Cessation, relapse prevention, and pharmacological support that can be provided by physician.;Education officer, environmental, assist with locating and accessing local/national Quit Smoking programs, and support quit date choice.    Expected Outcomes Short Term: Will demonstrate readiness to quit, by selecting a quit date.;Short Term: Will quit all tobacco product use, adhering to prevention of relapse plan.;Long Term: Complete abstinence from all tobacco products for at least 12 months from quit date.    Hypertension Yes    Intervention Provide education on lifestyle modifcations including regular physical  activity/exercise, weight management, moderate sodium restriction and increased consumption of fresh fruit, vegetables, and low fat dairy, alcohol moderation, and smoking cessation.;Monitor prescription use compliance.    Expected Outcomes Short Term: Continued assessment and intervention until BP is < 140/109mm HG in hypertensive participants. < 130/62mm HG in hypertensive participants with diabetes, heart failure or chronic kidney disease.;Long Term: Maintenance of blood pressure at goal levels.    Lipids Yes    Intervention Provide education  and support for participant on nutrition & aerobic/resistive exercise along with prescribed medications to achieve LDL 70mg , HDL >40mg .    Expected Outcomes Short Term: Participant states understanding of desired cholesterol values and is compliant with medications prescribed. Participant is following exercise prescription and nutrition guidelines.;Long Term: Cholesterol controlled with medications as prescribed, with individualized exercise RX and with personalized nutrition plan. Value goals: LDL < 70mg , HDL > 40 mg.             Education:Diabetes - Individual verbal and written instruction to review signs/symptoms of diabetes, desired ranges of glucose level fasting, after meals and with exercise. Acknowledge that pre and post exercise glucose checks will be done for 3 sessions at entry of program.   Core Components/Risk Factors/Patient Goals Review:   Goals and Risk Factor Review     Row Name 06/06/23 1213             Core Components/Risk Factors/Patient Goals Review   Personal Goals Review Tobacco Cessation       Review Mcdonald Reiling is a current tobacco user. Intervention for tobacco cessation was provided at the initial medical review. He} was asked about readiness to quit and reported he has cut backto3-4 cigarettes a day and does not have a quit date yet but is planning to continue to cut back and eventually quit . Patient was advised and  educated about tobacco cessation using combination therapy, tobacco cessation classes, quit line, and quit smoking apps. Patient demonstrated understanding of this material. Staff will continue to provide encouragement and follow up with the patient throughout the program       Expected Outcomes Short: continue to cut back and pick a quit  date. Long: become tobacco free                Core Components/Risk Factors/Patient Goals at Discharge (Final Review):   Goals and Risk Factor Review - 06/06/23 1213       Core Components/Risk Factors/Patient Goals Review   Personal Goals Review Tobacco Cessation    Review Gavan Nordby is a current tobacco user. Intervention for tobacco cessation was provided at the initial medical review. He} was asked about readiness to quit and reported he has cut backto3-4 cigarettes a day and does not have a quit date yet but is planning to continue to cut back and eventually quit . Patient was advised and educated about tobacco cessation using combination therapy, tobacco cessation classes, quit line, and quit smoking apps. Patient demonstrated understanding of this material. Staff will continue to provide encouragement and follow up with the patient throughout the program    Expected Outcomes Short: continue to cut back and pick a quit  date. Long: become tobacco free             ITP Comments:  ITP Comments     Row Name 05/30/23 1343 06/06/23 1200         ITP Comments Virtual Visit completed. Patient informed on EP and RD appointment and 6 Minute walk test. Patient also informed of patient health questionnaires on My Chart. Patient Verbalizes understanding. Visit diagnosis can be found in [facility] 05/23/2023. Completed and gym orientation. Initial ITP created and sent for review to Dr. Bethann Punches, Medical Director. Stanly Si is a current tobacco user. Intervention for tobacco cessation was provided at the initial medical review. He} was asked about  readiness to quit and reported he has cut backto3-4 cigarettes a day and does not have a quit date  yet but is planning to continue to cut back and eventually quit . Patient was advised and educated about tobacco cessation using combination therapy, tobacco cessation classes, quit line, and quit smoking apps. Patient demonstrated understanding of this material. Staff will continue to provide encouragement and follow up with the patient throughout the program.               Comments: initial ITP

## 2023-06-11 ENCOUNTER — Encounter: Admitting: *Deleted

## 2023-06-11 DIAGNOSIS — Z955 Presence of coronary angioplasty implant and graft: Secondary | ICD-10-CM | POA: Diagnosis not present

## 2023-06-11 DIAGNOSIS — I214 Non-ST elevation (NSTEMI) myocardial infarction: Secondary | ICD-10-CM

## 2023-06-11 NOTE — Progress Notes (Signed)
 Daily Session Note  Patient Details  Name: Gerald Garza MRN: [id number] Date of Birth: 08-16-1960 Referring Provider:   Flowsheet Row Cardiac Rehab from 06/06/2023 in [facility]  Referring Provider Dr. Bryan Lemma       Encounter Date: 06/11/2023  Check In:  Session Check In - 06/11/23 1730       Check-In   Supervising physician immediately available to respond to emergencies See telemetry face sheet for immediately available ER MD    Location [facility]-Cardiac & Pulmonary Rehab    Staff Present Susann Givens RN,BSN;[facility] [place] BS, ACSM CEP, Exercise Physiologist    Virtual Visit No    Medication changes reported     No    Fall or balance concerns reported    No    Tobacco Cessation No Change    Current number of cigarettes/nicotine per day     3    Warm-up and Cool-down Performed on first and last piece of equipment    Resistance Training Performed Yes    VAD Patient? No    PAD/SET Patient? No      Pain Assessment   Currently in Pain? No/denies                Social History   Tobacco Use  Smoking Status Every Day   Current packs/day: 0.50   Average packs/day: 0.5 packs/day for 40.0 years (20.0 ttl pk-yrs)   Types: Cigarettes  Smokeless Tobacco Never    Goals Met:  Independence with exercise equipment Exercise tolerated well No report of concerns or symptoms today Strength training completed today  Goals Unmet:  Not Applicable  Comments: First full day of exercise!  Patient was oriented to gym and equipment including functions, settings, policies, and procedures.  Patient's individual exercise prescription and treatment plan were reviewed.  All starting workloads were established based on the results of the 6 minute walk test done at initial orientation visit.  The plan for exercise progression was also introduced and progression will be customized based on patient's performance and goals.     Dr. Bethann Punches is Medical Director for [facility] Cardiac Rehabilitation.  Dr. Vida Rigger is Medical Director for [facility] Pulmonary Rehabilitation.

## 2023-06-13 ENCOUNTER — Encounter: Admitting: *Deleted

## 2023-06-13 ENCOUNTER — Encounter: Payer: Self-pay | Admitting: *Deleted

## 2023-06-13 DIAGNOSIS — Z955 Presence of coronary angioplasty implant and graft: Secondary | ICD-10-CM

## 2023-06-13 DIAGNOSIS — I214 Non-ST elevation (NSTEMI) myocardial infarction: Secondary | ICD-10-CM

## 2023-06-13 NOTE — Progress Notes (Signed)
 Cardiac Individual Treatment Plan  Patient Details  Name: Gerald Garza MRN: [id number] Date of Birth: [birth year] Referring Provider:   Flowsheet Row Cardiac Rehab from 06/06/2023 in [facility] Cardiac and Pulmonary Rehab  Referring Provider Dr. Bryan Lemma       Initial Encounter Date:  Flowsheet Row Cardiac Rehab from 06/06/2023 in [facility] Cardiac and Pulmonary Rehab  Date 06/06/23       Visit Diagnosis: NSTEMI (non-ST elevated myocardial infarction) [facility])  Status post coronary artery stent placement  Patient's Home Medications on Admission:  Current Outpatient Medications:    amLODipine (NORVASC) 5 MG tablet, Take 1 tablet (5 mg total) by mouth daily., Disp: 90 tablet, Rfl: 3   aspirin EC 81 MG tablet, Take 1 tablet (81 mg total) by mouth daily. Swallow whole., Disp: 30 tablet, Rfl: 12   glucosamine-chondroitin 500-400 MG tablet, Take 1 tablet by mouth 2 (two) times daily., Disp: , Rfl:    lisinopril-hydrochlorothiazide (PRINZIDE,ZESTORETIC) 20-25 MG tablet, Take 1 tablet by mouth daily., Disp: , Rfl:    Magnesium 250 MG TABS, Take by mouth 2 (two) times daily., Disp: , Rfl:    metoprolol succinate (TOPROL-XL) 25 MG 24 hr tablet, Take 0.5 tablets (12.5 mg total) by mouth daily., Disp: 30 tablet, Rfl: 3   Multiple Vitamin (MULTI-VITAMIN DAILY PO), Take by mouth., Disp: , Rfl:    Omega-3 Fatty Acids (OMEGA-3 EPA FISH OIL PO), Take by mouth. 3 per day, Disp: , Rfl:    prasugrel (EFFIENT) 10 MG TABS tablet, Take 1 tablet (10 mg total) by mouth daily., Disp: 90 tablet, Rfl: 3   rosuvastatin (CRESTOR) 20 MG tablet, Take 1 tablet (20 mg total) by mouth at bedtime., Disp: 90 tablet, Rfl: 3   vitamin B-12 (CYANOCOBALAMIN) 250 MCG tablet, Take 250 mcg by mouth daily., Disp: , Rfl:   Past Medical History: Past Medical History:  Diagnosis Date   Arthritis    CAD (coronary artery disease)    a. 05/2023 NSTEMI-->Myoview: high risk w/ septal/apical infarct/ischemia; b. 05/2023 Cath/PCI: LM nl, LAD  115m/d (2.5x22 Xience Frontier DES x 2), D2 50ost, LCX min irregs, RCA 20/20p-->12 mos DAPT followed by SAPT rec.   Diastolic dysfunction    a. 05/2023 Echo: EF 55-60%, no rwma, GrI DD, nl RV fxn, RVSP 22.1 mmHg.   Headache    hx cluster headaches   Hyperlipidemia LDL goal <70    Hypertension    Pneumonia 2015   bacterial    Tobacco Use: Social History   Tobacco Use  Smoking Status Every Day   Current packs/day: 0.50   Average packs/day: 0.5 packs/day for 40.0 years (20.0 ttl pk-yrs)   Types: Cigarettes  Smokeless Tobacco Never    Labs: Review Flowsheet       Latest Ref Rng & Units 05/23/2023  Labs for ITP Cardiac and Pulmonary Rehab  Cholestrol 0 - 200 mg/dL 782   LDL (calc) 0 - 99 mg/dL 85   HDL-C >95 mg/dL 41   Trlycerides <621 mg/dL 308      Exercise Target Goals: Exercise Program Goal: Individual exercise prescription set using results from initial 6 min walk test and THRR while considering  patient's activity barriers and safety.   Exercise Prescription Goal: Initial exercise prescription builds to 30-45 minutes a day of aerobic activity, 2-3 days per week.  Home exercise guidelines will be given to patient during program as part of exercise prescription that the participant will acknowledge.   Education: Aerobic Exercise: - Group verbal and  visual presentation on the components of exercise prescription. Introduces F.I.T.T principle from ACSM for exercise prescriptions.  Reviews F.I.T.T. principles of aerobic exercise including progression. Written material given at graduation.   Education: Resistance Exercise: - Group verbal and visual presentation on the components of exercise prescription. Introduces F.I.T.T principle from ACSM for exercise prescriptions  Reviews F.I.T.T. principles of resistance exercise including progression. Written material given at graduation.    Education: Exercise & Equipment Safety: - Individual verbal instruction and demonstration  of equipment use and safety with use of the equipment. Flowsheet Row Cardiac Rehab from 06/06/2023 in [facility] Cardiac and Pulmonary Rehab  Date 06/06/23  Educator [facility]  Instruction Review Code 1- Verbalizes Understanding       Education: Exercise Physiology & General Exercise Guidelines: - Group verbal and written instruction with models to review the exercise physiology of the cardiovascular system and associated critical values. Provides general exercise guidelines with specific guidelines to those with heart or lung disease.    Education: Flexibility, Balance, Mind/Body Relaxation: - Group verbal and visual presentation with interactive activity on the components of exercise prescription. Introduces F.I.T.T principle from ACSM for exercise prescriptions. Reviews F.I.T.T. principles of flexibility and balance exercise training including progression. Also discusses the mind body connection.  Reviews various relaxation techniques to help reduce and manage stress (i.e. Deep breathing, progressive muscle relaxation, and visualization). Balance handout provided to take home. Written material given at graduation.   Activity Barriers & Risk Stratification:  Activity Barriers & Cardiac Risk Stratification - 06/06/23 1205       Activity Barriers & Cardiac Risk Stratification   Activity Barriers Left Knee Replacement;Right Knee Replacement;Other (comment)    Comments Left shoulder replacement. No issues with shoulder replacement. No pain with knee replacements but does have some limitied ROM    Cardiac Risk Stratification Moderate             6 Minute Walk:  6 Minute Walk     Row Name 06/06/23 1202         6 Minute Walk   Phase Initial     Distance 1160 feet     Walk Time 6 minutes     # of Rest Breaks 0     MPH 2.2     METS 2.76     RPE 7     Perceived Dyspnea  0     VO2 Peak 9.66     Symptoms No     Resting HR 73 bpm     Resting BP 130/80     Resting Oxygen Saturation  96 %      Exercise Oxygen Saturation  during 6 min walk 96 %     Max Ex. HR 89 bpm     Max Ex. BP 150/80     2 Minute Post BP 124/80              Oxygen Initial Assessment:   Oxygen Re-Evaluation:   Oxygen Discharge (Final Oxygen Re-Evaluation):   Initial Exercise Prescription:  Initial Exercise Prescription - 06/06/23 1200       Date of Initial Exercise RX and Referring Provider   Date 06/06/23    Referring Provider Dr. Bryan Lemma      Oxygen   Maintain Oxygen Saturation 88% or higher      Treadmill   MPH 2    Grade 1    Minutes 15    METs 2.81      REL-XR   Level 2  Speed 50    Minutes 15    METs 2.76      T5 Nustep   Level 2    SPM 80    Minutes 15    METs 2.76      Prescription Details   Frequency (times per week) 3    Duration Progress to 30 minutes of continuous aerobic without signs/symptoms of physical distress      Intensity   THRR 40-80% of Max Heartrate 105-138    Ratings of Perceived Exertion 11-13    Perceived Dyspnea 0-4      Progression   Progression Continue to progress workloads to maintain intensity without signs/symptoms of physical distress.      Resistance Training   Training Prescription Yes    Weight 5    Reps 10-15             Perform Capillary Blood Glucose checks as needed.  Exercise Prescription Changes:   Exercise Prescription Changes     Row Name 06/06/23 1200             Response to Exercise   Blood Pressure (Admit) 130/80       Blood Pressure (Exercise) 150/80       Blood Pressure (Exit) 124/80       Heart Rate (Admit) 73 bpm       Heart Rate (Exercise) 89 bpm       Heart Rate (Exit) 75 bpm       Oxygen Saturation (Admit) 96 %       Oxygen Saturation (Exercise) 96 %       Oxygen Saturation (Exit) 97 %       Rating of Perceived Exertion (Exercise) 7       Perceived Dyspnea (Exercise) 0       Symptoms none       Comments 6 MWT results                Exercise Comments:   Exercise  Comments     Row Name 06/11/23 1733           Exercise Comments First full day of exercise!  Patient was oriented to gym and equipment including functions, settings, policies, and procedures.  Patient's individual exercise prescription and treatment plan were reviewed.  All starting workloads were established based on the results of the 6 minute walk test done at initial orientation visit.  The plan for exercise progression was also introduced and progression will be customized based on patient's performance and goals.                Exercise Goals and Review:   Exercise Goals     Row Name 06/06/23 1210             Exercise Goals   Increase Physical Activity Yes       Intervention Provide advice, education, support and counseling about physical activity/exercise needs.;Develop an individualized exercise prescription for aerobic and resistive training based on initial evaluation findings, risk stratification, comorbidities and participant's personal goals.       Expected Outcomes Short Term: Attend rehab on a regular basis to increase amount of physical activity.;Long Term: Add in home exercise to make exercise part of routine and to increase amount of physical activity.;Long Term: Exercising regularly at least 3-5 days a week.       Increase Strength and Stamina Yes       Intervention Provide advice, education, support and counseling about physical activity/exercise needs.;Develop  an individualized exercise prescription for aerobic and resistive training based on initial evaluation findings, risk stratification, comorbidities and participant's personal goals.       Expected Outcomes Short Term: Increase workloads from initial exercise prescription for resistance, speed, and METs.;Short Term: Perform resistance training exercises routinely during rehab and add in resistance training at home;Long Term: Improve cardiorespiratory fitness, muscular endurance and strength as measured by  increased METs and functional capacity ( )       Able to understand and use rate of perceived exertion (RPE) scale Yes       Intervention Provide education and explanation on how to use RPE scale       Expected Outcomes Short Term: Able to use RPE daily in rehab to express subjective intensity level;Long Term:  Able to use RPE to guide intensity level when exercising independently       Able to understand and use Dyspnea scale Yes       Intervention Provide education and explanation on how to use Dyspnea scale       Expected Outcomes Short Term: Able to use Dyspnea scale daily in rehab to express subjective sense of shortness of breath during exertion;Long Term: Able to use Dyspnea scale to guide intensity level when exercising independently       Knowledge and understanding of Target Heart Rate Range (THRR) Yes       Intervention Provide education and explanation of THRR including how the numbers were predicted and where they are located for reference       Expected Outcomes Short Term: Able to state/look up THRR;Long Term: Able to use THRR to govern intensity when exercising independently;Short Term: Able to use daily as guideline for intensity in rehab       Able to check pulse independently Yes       Intervention Provide education and demonstration on how to check pulse in carotid and radial arteries.;Review the importance of being able to check your own pulse for safety during independent exercise       Expected Outcomes Short Term: Able to explain why pulse checking is important during independent exercise;Long Term: Able to check pulse independently and accurately       Understanding of Exercise Prescription Yes       Intervention Provide education, explanation, and written materials on patient's individual exercise prescription       Expected Outcomes Short Term: Able to explain program exercise prescription;Long Term: Able to explain home exercise prescription to exercise independently                 Exercise Goals Re-Evaluation :  Exercise Goals Re-Evaluation     Row Name 06/11/23 1733             Exercise Goal Re-Evaluation   Exercise Goals Review Increase Physical Activity;Able to understand and use rate of perceived exertion (RPE) scale;Knowledge and understanding of Target Heart Rate Range (THRR);Understanding of Exercise Prescription;Increase Strength and Stamina;Able to check pulse independently       Comments Reviewed RPE and dyspnea scale, THR and program prescription with pt today.  Pt voiced understanding and was given a copy of goals to take home.       Expected Outcomes Short: Use RPE daily to regulate intensity.  Long: Follow program prescription in THR.                Discharge Exercise Prescription (Final Exercise Prescription Changes):  Exercise Prescription Changes - 06/06/23 1200  Response to Exercise   Blood Pressure (Admit) 130/80    Blood Pressure (Exercise) 150/80    Blood Pressure (Exit) 124/80    Heart Rate (Admit) 73 bpm    Heart Rate (Exercise) 89 bpm    Heart Rate (Exit) 75 bpm    Oxygen Saturation (Admit) 96 %    Oxygen Saturation (Exercise) 96 %    Oxygen Saturation (Exit) 97 %    Rating of Perceived Exertion (Exercise) 7    Perceived Dyspnea (Exercise) 0    Symptoms none    Comments 6 MWT results             Nutrition:  Target Goals: Understanding of nutrition guidelines, daily intake of sodium 1500mg , cholesterol 200mg , calories 30% from fat and 7% or less from saturated fats, daily to have 5 or more servings of fruits and vegetables.  Education: All About Nutrition: -Group instruction provided by verbal, written material, interactive activities, discussions, models, and posters to present general guidelines for heart healthy nutrition including fat, fiber, MyPlate, the role of sodium in heart healthy nutrition, utilization of the nutrition label, and utilization of this knowledge for meal planning. Follow  up email sent as well. Written material given at graduation.   Biometrics:  Pre Biometrics - 06/06/23 1211       Pre Biometrics   Height 5' 11.5" (1.816 m)    Weight 228 lb 1.6 oz (103.5 kg)    Waist Circumference 44 inches    Hip Circumference 44.5 inches    Waist to Hip Ratio 0.99 %    BMI (Calculated) 31.37    Single Leg Stand 15.68 seconds              Nutrition Therapy Plan and Nutrition Goals:  Nutrition Therapy & Goals - 06/06/23 1214       Intervention Plan   Intervention Prescribe, educate and counsel regarding individualized specific dietary modifications aiming towards targeted core components such as weight, hypertension, lipid management, diabetes, heart failure and other comorbidities.    Expected Outcomes Short Term Goal: Understand basic principles of dietary content, such as calories, fat, sodium, cholesterol and nutrients.;Short Term Goal: A plan has been developed with personal nutrition goals set during dietitian appointment.;Long Term Goal: Adherence to prescribed nutrition plan.             Nutrition Assessments:  MEDIFICTS Score Key: >=70 Need to make dietary changes  40-70 Heart Healthy Diet <= 40 Therapeutic Level Cholesterol Diet  Flowsheet Row Cardiac Rehab from 06/11/2023 in [facility] Cardiac and Pulmonary Rehab  Picture Your Plate Total Score on Admission 50      Picture Your Plate Scores: <29 Unhealthy dietary pattern with much room for improvement. 41-50 Dietary pattern unlikely to meet recommendations for good health and room for improvement. 51-60 More healthful dietary pattern, with some room for improvement.  >60 Healthy dietary pattern, although there may be some specific behaviors that could be improved.    Nutrition Goals Re-Evaluation:   Nutrition Goals Discharge (Final Nutrition Goals Re-Evaluation):   Psychosocial: Target Goals: Acknowledge presence or absence of significant depression and/or stress, maximize coping  skills, provide positive support system. Participant is able to verbalize types and ability to use techniques and skills needed for reducing stress and depression.   Education: Stress, Anxiety, and Depression - Group verbal and visual presentation to define topics covered.  Reviews how body is impacted by stress, anxiety, and depression.  Also discusses healthy ways to reduce stress and to treat/manage  anxiety and depression.  Written material given at graduation.   Education: Sleep Hygiene -Provides group verbal and written instruction about how sleep can affect your health.  Define sleep hygiene, discuss sleep cycles and impact of sleep habits. Review good sleep hygiene tips.    Initial Review & Psychosocial Screening:  Initial Psych Review & Screening - 05/30/23 1346       Initial Review   Current issues with None Identified      Family Dynamics   Good Support System? Yes    Comments Gerald Garza has his wife and 4 dogs to look to for support. He does not take anything for his mood and states no mental instability.      Barriers   Psychosocial barriers to participate in program There are no identifiable barriers or psychosocial needs.;The patient should benefit from training in stress management and relaxation.      Screening Interventions   Interventions Encouraged to exercise;To provide support and resources with identified psychosocial needs;Provide feedback about the scores to participant    Expected Outcomes Short Term goal: Utilizing psychosocial counselor, staff and physician to assist with identification of specific Stressors or current issues interfering with healing process. Setting desired goal for each stressor or current issue identified.;Long Term Goal: Stressors or current issues are controlled or eliminated.;Short Term goal: Identification and review with participant of any Quality of Life or Depression concerns found by scoring the questionnaire.;Long Term goal: The participant  improves quality of Life and PHQ9 Scores as seen by post scores and/or verbalization of changes             Quality of Life Scores:   Quality of Life - 06/11/23 1747       Quality of Life   Select Quality of Life      Quality of Life Scores   Health/Function Pre 26.57 %    Socioeconomic Pre 29.64 %    Psych/Spiritual Pre 30 %    Family Pre 30 %    GLOBAL Pre 28.37 %            Scores of 19 and below usually indicate a poorer quality of life in these areas.  A difference of  2-3 points is a clinically meaningful difference.  A difference of 2-3 points in the total score of the Quality of Life Index has been associated with significant improvement in overall quality of life, self-image, physical symptoms, and general health in studies assessing change in quality of life.  PHQ-9: Review Flowsheet       06/06/2023  Depression screen PHQ 2/9  Decreased Interest 0  Down, Depressed, Hopeless 0  PHQ - 2 Score 0  Altered sleeping 0  Tired, decreased energy 1  Change in appetite 0  Feeling bad or failure about yourself  0  Trouble concentrating 0  Moving slowly or fidgety/restless 0  Suicidal thoughts 0  PHQ-9 Score 1  Difficult doing work/chores Not difficult at all   Interpretation of Total Score  Total Score Depression Severity:  1-4 = Minimal depression, 5-9 = Mild depression, 10-14 = Moderate depression, 15-19 = Moderately severe depression, 20-27 = Severe depression   Psychosocial Evaluation and Intervention:  Psychosocial Evaluation - 05/30/23 1348       Psychosocial Evaluation & Interventions   Interventions Encouraged to exercise with the program and follow exercise prescription;Relaxation education;Stress management education    Comments Gerald Garza has his wife and 4 dogs to look to for support. He does not take anything for  his mood and states no mental instability.    Expected Outcomes Short: Start [facility] to help with mood. Long: Maintain a healthy mental state     Continue Psychosocial Services  Follow up required by staff             Psychosocial Re-Evaluation:   Psychosocial Discharge (Final Psychosocial Re-Evaluation):   Vocational Rehabilitation: Provide vocational rehab assistance to qualifying candidates.   Vocational Rehab Evaluation & Intervention:   Education: Education Goals: Education classes will be provided on a variety of topics geared toward better understanding of heart health and risk factor modification. Participant will state understanding/return demonstration of topics presented as noted by education test scores.  Learning Barriers/Preferences:  Learning Barriers/Preferences - 05/30/23 1344       Learning Barriers/Preferences   Learning Barriers None    Learning Preferences None             General Cardiac Education Topics:  AED/CPR: - Group verbal and written instruction with the use of models to demonstrate the basic use of the AED with the basic ABC's of resuscitation.   Anatomy and Cardiac Procedures: - Group verbal and visual presentation and models provide information about basic cardiac anatomy and function. Reviews the testing methods done to diagnose heart disease and the outcomes of the test results. Describes the treatment choices: Medical Management, Angioplasty, or Coronary Bypass Surgery for treating various heart conditions including Myocardial Infarction, Angina, Valve Disease, and Cardiac Arrhythmias.  Written material given at graduation.   Medication Safety: - Group verbal and visual instruction to review commonly prescribed medications for heart and lung disease. Reviews the medication, class of the drug, and side effects. Includes the steps to properly store meds and maintain the prescription regimen.  Written material given at graduation.   Intimacy: - Group verbal instruction through game format to discuss how heart and lung disease can affect sexual intimacy. Written material  given at graduation..   Know Your Numbers and Heart Failure: - Group verbal and visual instruction to discuss disease risk factors for cardiac and pulmonary disease and treatment options.  Reviews associated critical values for Overweight/Obesity, Hypertension, Cholesterol, and Diabetes.  Discusses basics of heart failure: signs/symptoms and treatments.  Introduces Heart Failure Zone chart for action plan for heart failure.  Written material given at graduation.   Infection Prevention: - Provides verbal and written material to individual with discussion of infection control including proper hand washing and proper equipment cleaning during exercise session. Flowsheet Row Cardiac Rehab from 06/06/2023 in [facility] Cardiac and Pulmonary Rehab  Date 06/06/23  Educator [facility]  Instruction Review Code 1- Verbalizes Understanding       Falls Prevention: - Provides verbal and written material to individual with discussion of falls prevention and safety. Flowsheet Row Cardiac Rehab from 06/06/2023 in [facility] Cardiac and Pulmonary Rehab  Date 06/06/23  Educator [facility]  Instruction Review Code 1- Verbalizes Understanding       Other: -Provides group and verbal instruction on various topics (see comments)   Knowledge Questionnaire Score:  Knowledge Questionnaire Score - 06/11/23 1747       Knowledge Questionnaire Score   Pre Score 21/26             Core Components/Risk Factors/Patient Goals at Admission:  Personal Goals and Risk Factors at Admission - 06/06/23 1212       Core Components/Risk Factors/Patient Goals on Admission   Intervention Weight Management: Develop a combined nutrition and exercise program designed to reach desired caloric intake,  while maintaining appropriate intake of nutrient and fiber, sodium and fats, and appropriate energy expenditure required for the weight goal.;Weight Management: Provide education and appropriate resources to help participant work on and attain dietary  goals.;Weight Management/Obesity: Establish reasonable short term and long term weight goals.;Obesity: Provide education and appropriate resources to help participant work on and attain dietary goals.    Expected Outcomes Short Term: Continue to assess and modify interventions until short term weight is achieved;Weight Loss: Understanding of general recommendations for a balanced deficit meal plan, which promotes 1-2 lb weight loss per week and includes a negative energy balance of [phone] kcal/d;Understanding recommendations for meals to include 15-35% energy as protein, 25-35% energy from fat, 35-60% energy from carbohydrates, less than 200mg  of dietary cholesterol, 20-35 gm of total fiber daily;Understanding of distribution of calorie intake throughout the day with the consumption of 4-5 meals/snacks;Long Term: Adherence to nutrition and physical activity/exercise program aimed toward attainment of established weight goal    Tobacco Cessation Yes    Number of packs per day .2    Intervention Assist the participant in steps to quit. Provide individualized education and counseling about committing to Tobacco Cessation, relapse prevention, and pharmacological support that can be provided by physician.;Education officer, environmental, assist with locating and accessing local/national Quit Smoking programs, and support quit date choice.    Expected Outcomes Short Term: Will demonstrate readiness to quit, by selecting a quit date.;Short Term: Will quit all tobacco product use, adhering to prevention of relapse plan.;Long Term: Complete abstinence from all tobacco products for at least 12 months from quit date.    Hypertension Yes    Intervention Provide education on lifestyle modifcations including regular physical activity/exercise, weight management, moderate sodium restriction and increased consumption of fresh fruit, vegetables, and low fat dairy, alcohol moderation, and smoking cessation.;Monitor  prescription use compliance.    Expected Outcomes Short Term: Continued assessment and intervention until BP is < 140/7mm HG in hypertensive participants. < 130/27mm HG in hypertensive participants with diabetes, heart failure or chronic kidney disease.;Long Term: Maintenance of blood pressure at goal levels.    Lipids Yes    Intervention Provide education and support for participant on nutrition & aerobic/resistive exercise along with prescribed medications to achieve LDL 70mg , HDL >40mg .    Expected Outcomes Short Term: Participant states understanding of desired cholesterol values and is compliant with medications prescribed. Participant is following exercise prescription and nutrition guidelines.;Long Term: Cholesterol controlled with medications as prescribed, with individualized exercise RX and with personalized nutrition plan. Value goals: LDL < 70mg , HDL > 40 mg.             Education:Diabetes - Individual verbal and written instruction to review signs/symptoms of diabetes, desired ranges of glucose level fasting, after meals and with exercise. Acknowledge that pre and post exercise glucose checks will be done for 3 sessions at entry of program.   Core Components/Risk Factors/Patient Goals Review:   Goals and Risk Factor Review     Row Name 06/06/23 1213             Core Components/Risk Factors/Patient Goals Review   Personal Goals Review Tobacco Cessation       Review Gerald Garza is a current tobacco user. Intervention for tobacco cessation was provided at the initial medical review. He} was asked about readiness to quit and reported he has cut backto3-4 cigarettes a day and does not have a quit date yet but is planning to continue to cut back and eventually quit .  Patient was advised and educated about tobacco cessation using combination therapy, tobacco cessation classes, quit line, and quit smoking apps. Patient demonstrated understanding of this material. Staff will  continue to provide encouragement and follow up with the patient throughout the program       Expected Outcomes Short: continue to cut back and pick a quit  date. Long: become tobacco free                Core Components/Risk Factors/Patient Goals at Discharge (Final Review):   Goals and Risk Factor Review - 06/06/23 1213       Core Components/Risk Factors/Patient Goals Review   Personal Goals Review Tobacco Cessation    Review Gerald Garza is a current tobacco user. Intervention for tobacco cessation was provided at the initial medical review. He} was asked about readiness to quit and reported he has cut backto3-4 cigarettes a day and does not have a quit date yet but is planning to continue to cut back and eventually quit . Patient was advised and educated about tobacco cessation using combination therapy, tobacco cessation classes, quit line, and quit smoking apps. Patient demonstrated understanding of this material. Staff will continue to provide encouragement and follow up with the patient throughout the program    Expected Outcomes Short: continue to cut back and pick a quit  date. Long: become tobacco free             ITP Comments:  ITP Comments     Row Name 05/30/23 1343 06/06/23 1200 06/11/23 1732 06/13/23 1137     ITP Comments Virtual Visit completed. Patient informed on EP and RD appointment and 6 Minute walk test. Patient also informed of patient health questionnaires on My Chart. Patient Verbalizes understanding. Visit diagnosis can be found in [facility] 05/23/2023. Completed and gym orientation. Initial ITP created and sent for review to Dr. Bethann Punches, Medical Director. Gerald Garza is a current tobacco user. Intervention for tobacco cessation was provided at the initial medical review. He} was asked about readiness to quit and reported he has cut backto3-4 cigarettes a day and does not have a quit date yet but is planning to continue to cut back and eventually quit .  Patient was advised and educated about tobacco cessation using combination therapy, tobacco cessation classes, quit line, and quit smoking apps. Patient demonstrated understanding of this material. Staff will continue to provide encouragement and follow up with the patient throughout the program. First full day of exercise!  Patient was oriented to gym and equipment including functions, settings, policies, and procedures.  Patient's individual exercise prescription and treatment plan were reviewed.  All starting workloads were established based on the results of the 6 minute walk test done at initial orientation visit.  The plan for exercise progression was also introduced and progression will be customized based on patient's performance and goals. 30 Day review completed. Medical Director ITP review done, changes made as directed, and signed approval by Medical Director.   new to prgram             Comments:

## 2023-06-13 NOTE — Progress Notes (Signed)
 Daily Session Note  Patient Details  Name: Gerald Garza MRN: [id number] Date of Birth: 1960-06-04 Referring Provider:   Flowsheet Row Cardiac Rehab from 06/06/2023 in [facility] Cardiac and Pulmonary Rehab  Referring Provider Dr. Bryan Lemma       Encounter Date: 06/13/2023  Check In:  Session Check In - 06/13/23 1737       Check-In   Supervising physician immediately available to respond to emergencies See telemetry face sheet for immediately available ER MD    Location [facility]-Cardiac & Pulmonary Rehab    Staff Present Elige Ko RCP,RRT,BSRT;Deslyn Cavenaugh Jewel Baize RN,BSN;Susanne Bice, RN, BSN, CCRP    Virtual Visit No    Medication changes reported     No    Fall or balance concerns reported    No    Tobacco Cessation No Change    Current number of cigarettes/nicotine per day     3    Warm-up and Cool-down Performed on first and last piece of equipment    Resistance Training Performed Yes    VAD Patient? No    PAD/SET Patient? No      Pain Assessment   Currently in Pain? No/denies                Social History   Tobacco Use  Smoking Status Every Day   Current packs/day: 0.50   Average packs/day: 0.5 packs/day for 40.0 years (20.0 ttl pk-yrs)   Types: Cigarettes  Smokeless Tobacco Never    Goals Met:  Independence with exercise equipment Exercise tolerated well No report of concerns or symptoms today Strength training completed today  Goals Unmet:  Not Applicable  Comments: Pt able to follow exercise prescription today without complaint.  Will continue to monitor for progression.    Dr. Bethann Punches is Medical Director for [facility] Cardiac Rehabilitation.  Dr. Vida Rigger is Medical Director for [facility] Pulmonary Rehabilitation.

## 2023-06-14 ENCOUNTER — Encounter: Admitting: *Deleted

## 2023-06-14 DIAGNOSIS — Z955 Presence of coronary angioplasty implant and graft: Secondary | ICD-10-CM

## 2023-06-14 DIAGNOSIS — I214 Non-ST elevation (NSTEMI) myocardial infarction: Secondary | ICD-10-CM

## 2023-06-14 NOTE — Progress Notes (Signed)
 Daily Session Note  Patient Details  Name: Gerald Garza MRN: [id number] Date of Birth: 12-06-1960 Referring Provider:   Flowsheet Row Cardiac Rehab from 06/06/2023 in [facility] Cardiac and Pulmonary Rehab  Referring Provider Dr. Bryan Lemma       Encounter Date: 06/14/2023  Check In:  Session Check In - 06/14/23 1126       Check-In   Supervising physician immediately available to respond to emergencies See telemetry face sheet for immediately available ER MD    Location [facility]-Cardiac & Pulmonary Rehab    Staff Present Cora Collum, RN, BSN, CCRP;Miquel Hood RCP,RRT,BSRT;Noah Tickle, BS, Exercise Physiologist;Maxon Conetta BS, Exercise Physiologist;Meredith Jewel Baize RN,BSN    Virtual Visit No    Medication changes reported     No    Fall or balance concerns reported    No    Warm-up and Cool-down Performed on first and last piece of equipment    Resistance Training Performed Yes    VAD Patient? No    PAD/SET Patient? No      Pain Assessment   Currently in Pain? No/denies                Social History   Tobacco Use  Smoking Status Every Day   Current packs/day: 0.50   Average packs/day: 0.5 packs/day for 40.0 years (20.0 ttl pk-yrs)   Types: Cigarettes  Smokeless Tobacco Never    Goals Met:  Independence with exercise equipment Exercise tolerated well No report of concerns or symptoms today  Goals Unmet:  Not Applicable  Comments: Pt able to follow exercise prescription today without complaint.  Will continue to monitor for progression.    Dr. Bethann Punches is Medical Director for [facility] Cardiac Rehabilitation.  Dr. Vida Rigger is Medical Director for [facility] Pulmonary Rehabilitation.

## 2023-06-18 ENCOUNTER — Encounter

## 2023-06-18 ENCOUNTER — Encounter: Admitting: *Deleted

## 2023-06-18 DIAGNOSIS — I214 Non-ST elevation (NSTEMI) myocardial infarction: Secondary | ICD-10-CM

## 2023-06-18 DIAGNOSIS — Z955 Presence of coronary angioplasty implant and graft: Secondary | ICD-10-CM

## 2023-06-18 NOTE — Progress Notes (Signed)
 Assessment start time: 3:51 PM  Digestive issues/concerns: no known food allergies   24-hours Recall: B: coffee L: ham and cheese sandwich with mustard D: pork roast in crock pot  Beverages 2% milk (2gallons per week), juice, water (when working) Alcohol lite beer Caffeine coffee  Education r/t nutrition plan Patient drinking lots of water when at work. But when he is home, he drinks lots of milk. Says he has been going through 2 gallons per week. Drinks some lite beers as well. Spoke with him about reducing fat content of milk, he says he used to drink whole milk but has cut down to 2%. Commended him on this change and encouraged him to continue to reduce fat percentage or reduce how much he is drinking. He will try to drink 1 gallon per week. Educated on identifying nutrients of concern like saturated fat and sodium. Provided guideline limits to sodium and saturated fat, 1500mg  and 15gSat fat respectively. Talked with him about his diet, sodium will need to be monitored and encouraged him to read labels to prevent getting surprised my foods that have way too much sodium. He also likes a lot of saturated rich foods. He may need to work on decreasing his saturated fat intake more. Encouraged and supported him on this journey to make better changes. Reviewed mediterranean diet handout. Educated on types of fats, sources, and how to read labels. Together, looked at several facts labels of foods he likes. Walked though label evaluations with aim to better his understanding and confidence in reading labels independently. Brainstormed several meals and snacks with foods he likes and will eat.      Goal 1: Reduce saturated fat, less than 12g per day. Replace bad fats for more heart healthy fats.  Goal 2: Read labels and reduce sodium intake to below 2300mg . Ideally 1500mg  per day.  Goal 3: Eat 15-30gProtein and 30-60gCarbs at each meal.  End time 5:13 PM

## 2023-06-18 NOTE — Progress Notes (Signed)
 Daily Session Note  Patient Details  Name: Gerald Garza MRN: [id number] Date of Birth: 1960/08/06 Referring Provider:   Flowsheet Row Cardiac Rehab from 06/06/2023 in [facility] Cardiac and Pulmonary Rehab  Referring Provider Dr. Randene Bustard       Encounter Date: 06/18/2023  Check In:  Session Check In - 06/18/23 1917       Check-In   Supervising physician immediately available to respond to emergencies See telemetry face sheet for immediately available ER MD    Location [facility]-Cardiac & Pulmonary Rehab    Staff Present Theone Fitting, RN, BSN, Debroah Fanning BS, ACSM CEP, Exercise Physiologist;Susanne Bice, RN, BSN, CCRP    Virtual Visit No    Medication changes reported     No    Fall or balance concerns reported    No    Tobacco Cessation No Change    Warm-up and Cool-down Performed on first and last piece of equipment    Resistance Training Performed Yes    VAD Patient? No    PAD/SET Patient? No      Pain Assessment   Currently in Pain? No/denies                Social History   Tobacco Use  Smoking Status Every Day   Current packs/day: 0.50   Average packs/day: 0.5 packs/day for 40.0 years (20.0 ttl pk-yrs)   Types: Cigarettes  Smokeless Tobacco Never    Goals Met:  Independence with exercise equipment Exercise tolerated well No report of concerns or symptoms today  Goals Unmet:  Not Applicable  Comments: Pt able to follow exercise prescription today without complaint.  Will continue to monitor for progression.    Dr. Firman Hughes is Medical Director for [facility] Cardiac Rehabilitation.  Dr. Fuad Aleskerov is Medical Director for [facility] Pulmonary Rehabilitation.

## 2023-06-20 ENCOUNTER — Encounter

## 2023-06-21 ENCOUNTER — Encounter: Admitting: *Deleted

## 2023-06-21 DIAGNOSIS — Z955 Presence of coronary angioplasty implant and graft: Secondary | ICD-10-CM | POA: Diagnosis not present

## 2023-06-21 DIAGNOSIS — I214 Non-ST elevation (NSTEMI) myocardial infarction: Secondary | ICD-10-CM

## 2023-06-21 NOTE — Progress Notes (Signed)
 Daily Session Note  Patient Details  Name: Gerald Garza MRN: [id number] Date of Birth: 1961/01/02 Referring Provider:   Flowsheet Row Cardiac Rehab from 06/06/2023 in [facility] Cardiac and Pulmonary Rehab  Referring Provider Dr. Randene Bustard       Encounter Date: 06/21/2023  Check In:  Session Check In - 06/21/23 1722       Check-In   Supervising physician immediately available to respond to emergencies See telemetry face sheet for immediately available ER MD    Location [facility]-Cardiac & Pulmonary Rehab    Staff Present Sue Em RN,BSN;Maxon Conetta BS, Exercise Physiologist;Laureen Bevin Bucks, BS, RRT, CPFT    Virtual Visit No    Medication changes reported     No    Fall or balance concerns reported    No    Warm-up and Cool-down Performed on first and last piece of equipment    Resistance Training Performed Yes    VAD Patient? No    PAD/SET Patient? No      Pain Assessment   Currently in Pain? No/denies                Social History   Tobacco Use  Smoking Status Every Day   Current packs/day: 0.50   Average packs/day: 0.5 packs/day for 40.0 years (20.0 ttl pk-yrs)   Types: Cigarettes  Smokeless Tobacco Never    Goals Met:  Independence with exercise equipment Exercise tolerated well No report of concerns or symptoms today Strength training completed today  Goals Unmet:  Not Applicable  Comments: Pt able to follow exercise prescription today without complaint.  Will continue to monitor for progression.    Dr. Firman Hughes is Medical Director for [facility] Cardiac Rehabilitation.  Dr. Fuad Aleskerov is Medical Director for [facility] Pulmonary Rehabilitation.

## 2023-06-25 ENCOUNTER — Encounter: Admitting: *Deleted

## 2023-06-25 DIAGNOSIS — Z955 Presence of coronary angioplasty implant and graft: Secondary | ICD-10-CM | POA: Diagnosis not present

## 2023-06-25 DIAGNOSIS — I214 Non-ST elevation (NSTEMI) myocardial infarction: Secondary | ICD-10-CM | POA: Diagnosis not present

## 2023-06-25 NOTE — Progress Notes (Signed)
 Daily Session Note  Patient Details  Name: Gerald Garza MRN: [id number] Date of Birth: [birth year] Referring Provider:   Flowsheet Row Cardiac Rehab from 06/06/2023 in [facility] Cardiac and Pulmonary Rehab  Referring Provider Dr. Randene Bustard       Encounter Date: 06/25/2023  Check In:  Session Check In - 06/25/23 1712       Check-In   Supervising physician immediately available to respond to emergencies See telemetry face sheet for immediately available ER MD    Location [facility]-Cardiac & Pulmonary Rehab    Staff Present Sue Em RN,BSN;Kalonji Nancey Awkward;Maud Sorenson, RN, BSN, [employer] BS, ACSM CEP, Exercise Physiologist    Virtual Visit No    Medication changes reported     No    Fall or balance concerns reported    No    Tobacco Cessation No Change    Current number of cigarettes/nicotine per day     3    Warm-up and Cool-down Performed on first and last piece of equipment    Resistance Training Performed Yes    VAD Patient? No    PAD/SET Patient? No      Pain Assessment   Currently in Pain? No/denies                Social History   Tobacco Use  Smoking Status Every Day   Current packs/day: 0.50   Average packs/day: 0.5 packs/day for 40.0 years (20.0 ttl pk-yrs)   Types: Cigarettes  Smokeless Tobacco Never    Goals Met:  Independence with exercise equipment Exercise tolerated well No report of concerns or symptoms today Strength training completed today  Goals Unmet:  Not Applicable  Comments: Pt able to follow exercise prescription today without complaint.  Will continue to monitor for progression.    Dr. Firman Hughes is Medical Director for [facility] Cardiac Rehabilitation.  Dr. Fuad Aleskerov is Medical Director for [facility] Pulmonary Rehabilitation.

## 2023-06-27 ENCOUNTER — Encounter: Admitting: *Deleted

## 2023-06-27 DIAGNOSIS — I214 Non-ST elevation (NSTEMI) myocardial infarction: Secondary | ICD-10-CM | POA: Diagnosis not present

## 2023-06-27 DIAGNOSIS — Z955 Presence of coronary angioplasty implant and graft: Secondary | ICD-10-CM | POA: Diagnosis not present

## 2023-06-27 NOTE — Progress Notes (Signed)
 Daily Session Note  Patient Details  Name: Gerald Garza MRN: [id number] Date of Birth: [birth year] Referring Provider:   Flowsheet Row Cardiac Rehab from 06/06/2023 in [facility] Cardiac and Pulmonary Rehab  Referring Provider Dr. Randene Bustard       Encounter Date: 06/27/2023  Check In:  Session Check In - 06/27/23 1702       Check-In   Supervising physician immediately available to respond to emergencies See telemetry face sheet for immediately available ER MD    Location [facility]-Cardiac & Pulmonary Rehab    Staff Present Sue Em RN,BSN;Trask Nancey Awkward;Maud Sorenson, RN, BSN, [employer], ACSM CEP, Exercise Physiologist    Virtual Visit No    Medication changes reported     No    Fall or balance concerns reported    No    Warm-up and Cool-down Performed on first and last piece of equipment    Resistance Training Performed Yes    VAD Patient? No    PAD/SET Patient? No      Pain Assessment   Currently in Pain? No/denies                Social History   Tobacco Use  Smoking Status Every Day   Current packs/day: 0.50   Average packs/day: 0.5 packs/day for 40.0 years (20.0 ttl pk-yrs)   Types: Cigarettes  Smokeless Tobacco Never    Goals Met:  Independence with exercise equipment Exercise tolerated well No report of concerns or symptoms today Strength training completed today  Goals Unmet:  Not Applicable  Comments: Pt able to follow exercise prescription today without complaint.  Will continue to monitor for progression.    Dr. Firman Hughes is Medical Director for [facility] Cardiac Rehabilitation.  Dr. Fuad Aleskerov is Medical Director for [facility] Pulmonary Rehabilitation.

## 2023-06-28 ENCOUNTER — Encounter: Admitting: *Deleted

## 2023-06-28 DIAGNOSIS — Z955 Presence of coronary angioplasty implant and graft: Secondary | ICD-10-CM

## 2023-06-28 DIAGNOSIS — I214 Non-ST elevation (NSTEMI) myocardial infarction: Secondary | ICD-10-CM

## 2023-06-28 NOTE — Progress Notes (Signed)
 Daily Session Note  Patient Details  Name: Gerald Garza MRN: [id number] Date of Birth: October 06, 1960 Referring Provider:   Flowsheet Row Cardiac Rehab from 06/06/2023 in [facility] Cardiac and Pulmonary Rehab  Referring Provider Dr. Randene Bustard       Encounter Date: 06/28/2023  Check In:  Session Check In - 06/28/23 1734       Check-In   Supervising physician immediately available to respond to emergencies See telemetry face sheet for immediately available ER MD    Location [facility]-Cardiac & Pulmonary Rehab    Staff Present Sue Em RN,BSN;[facility] BS, Exercise Physiologist    Virtual Visit No    Medication changes reported     No    Fall or balance concerns reported    No    Tobacco Cessation No Change    Current number of cigarettes/nicotine per day     3    Warm-up and Cool-down Performed on first and last piece of equipment    Resistance Training Performed Yes    VAD Patient? No    PAD/SET Patient? No      Pain Assessment   Currently in Pain? No/denies                Social History   Tobacco Use  Smoking Status Every Day   Current packs/day: 0.50   Average packs/day: 0.5 packs/day for 40.0 years (20.0 ttl pk-yrs)   Types: Cigarettes  Smokeless Tobacco Never    Goals Met:  Independence with exercise equipment Exercise tolerated well No report of concerns or symptoms today Strength training completed today  Goals Unmet:  Not Applicable  Comments: Pt able to follow exercise prescription today without complaint.  Will continue to monitor for progression.    Dr. Firman Hughes is Medical Director for [facility] Cardiac Rehabilitation.  Dr. Fuad Aleskerov is Medical Director for [facility] Pulmonary Rehabilitation.

## 2023-06-29 ENCOUNTER — Encounter: Payer: Self-pay | Admitting: Nurse Practitioner

## 2023-06-29 ENCOUNTER — Ambulatory Visit: Attending: Nurse Practitioner | Admitting: Nurse Practitioner

## 2023-06-29 VITALS — BP 118/78 | HR 80 | Resp 17 | Ht 71.0 in | Wt 225.0 lb

## 2023-06-29 DIAGNOSIS — E785 Hyperlipidemia, unspecified: Secondary | ICD-10-CM | POA: Diagnosis not present

## 2023-06-29 DIAGNOSIS — I2510 Atherosclerotic heart disease of native coronary artery without angina pectoris: Secondary | ICD-10-CM

## 2023-06-29 DIAGNOSIS — F1010 Alcohol abuse, uncomplicated: Secondary | ICD-10-CM

## 2023-06-29 DIAGNOSIS — I10 Essential (primary) hypertension: Secondary | ICD-10-CM | POA: Diagnosis not present

## 2023-06-29 DIAGNOSIS — Z720 Tobacco use: Secondary | ICD-10-CM | POA: Diagnosis not present

## 2023-06-29 NOTE — Patient Instructions (Signed)
 Medication Instructions:  No changes *If you need a refill on your cardiac medications before your next appointment, please call your pharmacy*  Lab Work: Your provider would like for you to return in in the next week to have the following labs drawn: Fasting Lipid and Liver.   Please go to [facility] [address] ([facility] [address], [place] [address] You do not need an appointment.  They are open from 8 am- 4:30 pm.  Lunch from 1:00 pm- 2:00 pm You will  need to be fasting.   You may also go to one of the following [facility]:  [address] [place], [place] [address] Phone: [phone] Lab hours: Mon-Fri 8 am- 5 pm    Lunch 12 pm- 1 pm  [address] [place],  [place]  [address]  [place]  Phone: [phone] Lab hours: 7 am- 4 pm Lunch 12 pm-1 pm   [address] [place],  [place]  [address]  [place]  Phone: [phone] Lab hours: Mon-Fri 8 am- 5 pm    Lunch 12 pm- 1 pm   If you have labs (blood work) drawn today and your tests are completely normal, you will receive your results only by: MyChart Message (if you have MyChart) OR A paper copy in the mail If you have any lab test that is abnormal or we need to change your treatment, we will call you to review the results.  Testing/Procedures: None ordered  Follow-Up: At [facility], you and your health needs are our priority.  As part of our continuing mission to provide you with exceptional heart care, our providers are all part of one team.  This team includes your primary Cardiologist (physician) and Advanced Practice Providers or APPs (Physician Assistants and Nurse Practitioners) who all work together to provide you with the care you need, when you need it.  Your next appointment:   3 month(s)  Provider:   You may see Timothy Gollan, MD or one of the following Advanced Practice Providers on your designated Care Team:   Laneta Pintos, NP   We recommend signing up for the patient  portal called "MyChart".  Sign up information is provided on this After Visit Summary.  MyChart is used to connect with patients for Virtual Visits (Telemedicine).  Patients are able to view lab/test results, encounter notes, upcoming appointments, etc.  Non-urgent messages can be sent to your provider as well.   To learn more about what you can do with MyChart, go to [email or web address].

## 2023-06-29 NOTE — Progress Notes (Signed)
 Office Visit    Patient Name: CALIX HEINBAUGH Date of Encounter: 06/29/2023  Primary Care Provider:  Lysle Saunas, MD (Inactive) Primary Cardiologist:  Belva Boyden, MD  Chief Complaint    63 y.o. male CAD status post non-STEMI and drug-eluting stent placement to the LAD in March 2025, hypertension, hyperlipidemia, tobacco abuse, and osteoarthritis, who presents for CAD follow-up.  Past Medical History  Subjective   Past Medical History:  Diagnosis Date   Arthritis    CAD (coronary artery disease)    a. 05/2023 NSTEMI-->Myoview : high risk w/ septal/apical infarct/ischemia; b. 05/2023 Cath/PCI: LM nl, LAD 193m/d (2.5x22 Xience Frontier DES x 2), D2 50ost, LCX min irregs, RCA 20/20p-->12 mos DAPT followed by SAPT rec.   Diastolic dysfunction    a. 05/2023 Echo: EF 55-60%, no rwma, GrI DD, nl RV fxn, RVSP 22.1 mmHg.   Headache    hx cluster headaches   Hyperlipidemia LDL goal <70    Hypertension    Pneumonia 2015   bacterial   Past Surgical History:  Procedure Laterality Date   bunion surgery Left    right hammer to done also   COLONOSCOPY WITH PROPOFOL  N/A 10/12/2015   Procedure: COLONOSCOPY WITH PROPOFOL ;  Surgeon: Garrett Kallman, MD;  Location: [facility];  Service: Endoscopy;  Laterality: N/A;   CORONARY STENT INTERVENTION N/A 05/24/2023   Procedure: CORONARY STENT INTERVENTION;  Surgeon: Arleen Lacer, MD;  Location: [facility];  Service: Cardiovascular;  Laterality: N/A;   LEFT HEART CATH AND CORONARY ANGIOGRAPHY N/A 05/24/2023   Procedure: LEFT HEART CATH AND CORONARY ANGIOGRAPHY;  Surgeon: Arleen Lacer, MD;  Location: [facility] CV [facility];  Service: Cardiovascular;  Laterality: N/A;   REPLACEMENT TOTAL KNEE Bilateral    TONSILLECTOMY  age 53    Allergies  Allergies  Allergen Reactions   Codeine Nausea Only    Itching also   Hydrocodone     Other Reaction(s): Not available  hydrocodone      History of Present Illness      63 y.o.  y/o male with a history of CAD, hypertension, hyperlipidemia, tobacco abuse, and osteoarthritis.. Mr. Mendiola was admitted to [facility] on 05/23/23 with chest pain and ruled in for non-STEMI with mild troponin elevation to 41.  Stress test in the hospital showed septal and apical perfusion defect with reversible ischemia. Cardiac cath was completed on 05/2023, showing an occluded mid-LAD which was successfully treated with overlapping DES x 2.  Post-cath Echo showed and EF of 55-60%. He was d/c'd home on post-procedure day 1 with recommendation for DAPT (asa/prasugrel ) x 12 mos followed by thienopyridine monotherapy long term.    Mr. Pepitone was doing well at his follow-up visit in March 2025.  Since then, he has been participating in cardiac rehab 3 times a week symptoms or limitations.  He has been enjoying cardiac rehab and feels more motivated.  He did not realize how out of shape and short of breath he was prior to his non-STEMI.  He notes that he and his wife have made significant changes to their diet.  He has cut down on smoking, from 1 pack a day to 4 to 5 cigarettes a day.  He has been having trouble getting rid of the last 4 to 5 cigarettes and now plans to use patches.  We agreed that he should start at 14 mg daily.  He continues to drink light beer.  He will typically have 3-4 beers in the evening  though sometimes, he may go several days without any beer but then have 12 on the weekend.  We discussed the ongoing risks associated with binge drinking.  He denies chest pain, dyspnea, palpitations, PND, orthopnea, dizziness, syncope, edema, or early satiety. Objective  Home Medications    Current Outpatient Medications  Medication Sig Dispense Refill   amLODipine  (NORVASC ) 5 MG tablet Take 1 tablet (5 mg total) by mouth daily. 90 tablet 3   aspirin  EC 81 MG tablet Take 1 tablet (81 mg total) by mouth daily. Swallow whole. 30 tablet 12   fish oil-omega-3 fatty acids 1000 MG capsule Take 2 g by mouth  daily.     lisinopril -hydrochlorothiazide  (PRINZIDE ,ZESTORETIC ) 20-25 MG tablet Take 1 tablet by mouth daily.     Magnesium 250 MG TABS Take by mouth 2 (two) times daily.     metoprolol  succinate (TOPROL -XL) 25 MG 24 hr tablet Take 0.5 tablets (12.5 mg total) by mouth daily. 30 tablet 3   Multiple Vitamin (MULTI-VITAMIN DAILY PO) Take by mouth.     prasugrel  (EFFIENT ) 10 MG TABS tablet Take 1 tablet (10 mg total) by mouth daily. 90 tablet 3   rosuvastatin  (CRESTOR ) 20 MG tablet Take 1 tablet (20 mg total) by mouth at bedtime. 90 tablet 3   vitamin B-12 (CYANOCOBALAMIN) 250 MCG tablet Take 250 mcg by mouth daily.     No current facility-administered medications for this visit.     Physical Exam    VS:  BP 118/78 (BP Location: Left Arm, Cuff Size: Large)   Pulse 80   Resp 17   Ht 5\' 11"  (1.803 m)   Wt 225 lb (102.1 kg)   SpO2 95%   BMI 31.38 kg/m  , BMI Body mass index is 31.38 kg/m.       GEN: Well nourished, well developed, in no acute distress. HEENT: normal. Neck: Supple, no JVD, carotid bruits, or masses. Cardiac: RRR, no murmurs, rubs, or gallops. No clubbing, cyanosis, edema.  Radials 2+/PT 2+ and equal bilaterally.  Respiratory:  Respirations regular and unlabored, clear to auscultation bilaterally. GI: Soft, nontender, nondistended, BS + x 4. MS: no deformity or atrophy. Skin: warm and dry, no rash. Neuro:  Strength and sensation are intact. Psych: Normal affect.  Accessory Clinical Findings    Lab Results  Component Value Date   WBC 6.8 05/25/2023   HGB 14.9 05/25/2023   HCT 42.4 05/25/2023   MCV 94.0 05/25/2023   PLT 176 05/25/2023   Lab Results  Component Value Date   CREATININE 0.82 05/25/2023   BUN 19 05/25/2023   NA 140 05/25/2023   K 3.5 05/25/2023   CL 106 05/25/2023   CO2 24 05/25/2023    Lab Results  Component Value Date   CHOL 200 05/23/2023   HDL 41 05/23/2023   LDLCALC 85 05/23/2023   TRIG 368 (H) 05/23/2023   CHOLHDL 4.9 05/23/2023     Lab Results  Component Value Date   TSH 1.022 05/23/2023       Assessment & Plan    1.  Coronary artery disease: Status post non-STEMI in March 2025 with abnormal Myoview  leading to diagnostic catheterization revealing an occluded mid LAD, now status post overlapping drug-eluting stents x 2.  EF was 55 to 60% by echo postprocedure.  He has been doing well without chest pain or dyspnea and is tolerating cardiac rehab well.  He has made some lifestyle changes and is motivated to make more.  He continues to smoke  and drink alcohol and we discussed the importance of cessation of these substances today.  He remains on aspirin , lisinopril , metoprolol , Effient , and Crestor .  We will continue dual antiplatelet therapy for 12 months with plan for thienopyridine monotherapy thereafter as previously outlined by interventional cardiology.  2.  Primary hypertension: Pressure is normal today at 118/78.  Continue beta-blocker, ACE inhibitor, HCTZ, and calcium  channel blocker.  3.  Hyperlipidemia: LDL was 85 during hospitalization.  He was statin nave at that time.  He remains on rosuvastatin  therapy and is tolerating well.  He is not fasting today.  We will arrange for follow-up lipids and LFTs 1 day next week and he has agreed to come in to have labs drawn.  4.  Tobacco abuse: Still smoking 4 to 5 cigarettes a day, down from 1 pack/day prior to hospitalization.  He is struggling to quit completely and will begin using nicotine patches.  We discussed that he may also use nicotine gum to supplement if necessary.  I did discuss that if nicotine patches are inadequate to help him to quit, that Chantix is an option.  He was not interested in a prescription for Chantix today.  5.  Alcohol abuse: Drinking a case of Miller light per week.  Discussed the importance of cutting back and preferably complete cessation given ongoing risks of alcohol related cardiomyopathy, arrhythmias, and cancers.  Patient was not aware  that his drinking often qualifies his binge drinking.  He will try and cut back.  6.  Disposition: Follow-up lipids and LFTs next week.  Follow-up in clinic in 3 months or sooner if necessary.  Laneta Pintos, NP 06/29/2023, 5:33 PM

## 2023-07-02 ENCOUNTER — Encounter: Admitting: *Deleted

## 2023-07-02 DIAGNOSIS — Z955 Presence of coronary angioplasty implant and graft: Secondary | ICD-10-CM

## 2023-07-02 DIAGNOSIS — I214 Non-ST elevation (NSTEMI) myocardial infarction: Secondary | ICD-10-CM

## 2023-07-02 NOTE — Progress Notes (Signed)
 Daily Session Note  Patient Details  Name: Gerald Garza MRN: [id number] Date of Birth: 1960/03/23 Referring Provider:   Flowsheet Row Cardiac Rehab from 06/06/2023 in [facility]  Referring Provider Dr. Randene Bustard       Encounter Date: 07/02/2023  Check In:  Session Check In - 07/02/23 1720       Check-In   Supervising physician immediately available to respond to emergencies See telemetry face sheet for immediately available ER MD    Location [facility]    Staff Present Sue Em RN,BSN;Zaid [facility] [place] [facility] BS, ACSM CEP, Exercise Physiologist    Virtual Visit No    Medication changes reported     No    Fall or balance concerns reported    No    Tobacco Cessation No Change    Current number of cigarettes/nicotine per day     3    Warm-up and Cool-down Performed on first and last piece of equipment    Resistance Training Performed Yes    VAD Patient? No    PAD/SET Patient? No      Pain Assessment   Currently in Pain? No/denies                Social History   Tobacco Use  Smoking Status Every Day   Current packs/day: 0.50   Average packs/day: 0.5 packs/day for 40.0 years (20.0 ttl pk-yrs)   Types: Cigarettes  Smokeless Tobacco Never    Goals Met:  Independence with exercise equipment Exercise tolerated well No report of concerns or symptoms today Strength training completed today  Goals Unmet:  Not Applicable  Comments: Pt able to follow exercise prescription today without complaint.  Will continue to monitor for progression.    Dr. Firman Hughes is Medical Director for [facility] Cardiac Rehabilitation.  Dr. Fuad Aleskerov is Medical Director for [facility] Pulmonary Rehabilitation.

## 2023-07-03 LAB — HEPATIC FUNCTION PANEL
ALT: 38 IU/L (ref 0–44)
AST: 26 IU/L (ref 0–40)
Albumin: 4.8 g/dL (ref 3.9–4.9)
Alkaline Phosphatase: 77 IU/L (ref 44–121)
Bilirubin Total: 0.4 mg/dL (ref 0.0–1.2)
Bilirubin, Direct: 0.16 mg/dL (ref 0.00–0.40)
Total Protein: 6.7 g/dL (ref 6.0–8.5)

## 2023-07-03 LAB — LIPID PANEL
Chol/HDL Ratio: 2.6 ratio (ref 0.0–5.0)
Cholesterol, Total: 123 mg/dL (ref 100–199)
HDL: 47 mg/dL (ref >39)
LDL Chol Calc (NIH): 58 mg/dL (ref 0–99)
Triglycerides: 95 mg/dL (ref 0–149)
VLDL Cholesterol Cal: 18 mg/dL (ref 5–40)

## 2023-07-04 ENCOUNTER — Encounter

## 2023-07-05 ENCOUNTER — Encounter: Attending: Cardiology | Admitting: *Deleted

## 2023-07-05 DIAGNOSIS — I214 Non-ST elevation (NSTEMI) myocardial infarction: Secondary | ICD-10-CM | POA: Diagnosis not present

## 2023-07-05 DIAGNOSIS — Z955 Presence of coronary angioplasty implant and graft: Secondary | ICD-10-CM | POA: Diagnosis not present

## 2023-07-05 NOTE — Progress Notes (Signed)
 Daily Session Note  Patient Details  Name: Gerald Garza MRN: [id number] Date of Birth: 04/09/1960 Referring Provider:   Flowsheet Row Cardiac Rehab from 06/06/2023 in [facility]  Referring Provider Dr. Randene Bustard       Encounter Date: 07/05/2023  Check In:  Session Check In - 07/05/23 1716       Check-In   Supervising physician immediately available to respond to emergencies See telemetry face sheet for immediately available ER MD    Location [facility]    Staff Present Sue Em RN,BSN;Tadarrius [facility] BS, Exercise Physiologist    Virtual Visit No    Medication changes reported     No    Fall or balance concerns reported    No    Warm-up and Cool-down Performed on first and last piece of equipment    Resistance Training Performed Yes    VAD Patient? No    PAD/SET Patient? No      Pain Assessment   Currently in Pain? No/denies                Social History   Tobacco Use  Smoking Status Every Day   Current packs/day: 0.50   Average packs/day: 0.5 packs/day for 40.0 years (20.0 ttl pk-yrs)   Types: Cigarettes  Smokeless Tobacco Never    Goals Met:  Independence with exercise equipment Exercise tolerated well No report of concerns or symptoms today Strength training completed today  Goals Unmet:  Not Applicable  Comments: Pt able to follow exercise prescription today without complaint.  Will continue to monitor for progression.    Dr. Firman Hughes is Medical Director for [facility] Cardiac Rehabilitation.  Dr. Fuad Aleskerov is Medical Director for [facility] Pulmonary Rehabilitation.

## 2023-07-09 ENCOUNTER — Encounter: Payer: Self-pay | Admitting: *Deleted

## 2023-07-09 ENCOUNTER — Encounter: Admitting: *Deleted

## 2023-07-09 DIAGNOSIS — Z955 Presence of coronary angioplasty implant and graft: Secondary | ICD-10-CM

## 2023-07-09 DIAGNOSIS — I214 Non-ST elevation (NSTEMI) myocardial infarction: Secondary | ICD-10-CM

## 2023-07-09 NOTE — Progress Notes (Signed)
 Daily Session Note  Patient Details  Name: Gerald Garza MRN: [id number] Date of Birth: [birth year] Referring Provider:   Flowsheet Row Cardiac Rehab from 06/06/2023 in [facility] Cardiac and Pulmonary Rehab  Referring Provider Dr. Randene Bustard       Encounter Date: 07/09/2023  Check In:  Session Check In - 07/09/23 1745       Check-In   Supervising physician immediately available to respond to emergencies See telemetry face sheet for immediately available ER MD    Location [facility]    Staff Present Maud Sorenson, RN, BSN, CCRP;Aaditya Hood RCP,RRT,BSRT;Kelly Gaston BS, ACSM CEP, Exercise Physiologist    Virtual Visit No    Medication changes reported     No    Fall or balance concerns reported    No    Warm-up and Cool-down Performed on first and last piece of equipment    Resistance Training Performed Yes    VAD Patient? No    PAD/SET Patient? No      Pain Assessment   Currently in Pain? No/denies                Social History   Tobacco Use  Smoking Status Every Day   Current packs/day: 0.50   Average packs/day: 0.5 packs/day for 40.0 years (20.0 ttl pk-yrs)   Types: Cigarettes  Smokeless Tobacco Never    Goals Met:  Independence with exercise equipment Exercise tolerated well No report of concerns or symptoms today  Goals Unmet:  Not Applicable  Comments: Pt able to follow exercise prescription today without complaint.  Will continue to monitor for progression.    Dr. Firman Hughes is Medical Director for [facility] Cardiac Rehabilitation.  Dr. Fuad Aleskerov is Medical Director for [facility] Pulmonary Rehabilitation.

## 2023-07-11 ENCOUNTER — Encounter: Admitting: *Deleted

## 2023-07-11 DIAGNOSIS — Z955 Presence of coronary angioplasty implant and graft: Secondary | ICD-10-CM

## 2023-07-11 DIAGNOSIS — I214 Non-ST elevation (NSTEMI) myocardial infarction: Secondary | ICD-10-CM

## 2023-07-11 NOTE — Progress Notes (Signed)
 Daily Session Note  Patient Details  Name: NIKOLIS GOVERNALE MRN: [id number] Date of Birth: [birth year] Referring Provider:   Flowsheet Row Cardiac Rehab from 06/06/2023 in [facility] Cardiac and Pulmonary Rehab  Referring Provider Dr. Randene Bustard       Encounter Date: 07/11/2023  Check In:  Session Check In - 07/11/23 1726       Check-In   Supervising physician immediately available to respond to emergencies See telemetry face sheet for immediately available ER MD    Location [facility]    Staff Present Sue Em RN,BSN;Izaiha [facility] BS, ACSM CEP, Exercise Physiologist    Virtual Visit No    Medication changes reported     No    Fall or balance concerns reported    No    Tobacco Cessation No Change    Current number of cigarettes/nicotine per day     3    Warm-up and Cool-down Performed on first and last piece of equipment    Resistance Training Performed Yes    VAD Patient? No    PAD/SET Patient? No      Pain Assessment   Currently in Pain? No/denies                Social History   Tobacco Use  Smoking Status Every Day   Current packs/day: 0.50   Average packs/day: 0.5 packs/day for 40.0 years (20.0 ttl pk-yrs)   Types: Cigarettes  Smokeless Tobacco Never    Goals Met:  Independence with exercise equipment Exercise tolerated well No report of concerns or symptoms today Strength training completed today  Goals Unmet:  Not Applicable  Comments: Pt able to follow exercise prescription today without complaint.  Will continue to monitor for progression.    Dr. Firman Hughes is Medical Director for [facility] Cardiac Rehabilitation.  Dr. Fuad Aleskerov is Medical Director for [facility] Pulmonary Rehabilitation.

## 2023-07-11 NOTE — Progress Notes (Signed)
 30 Day review completed. Medical Director ITP review done, changes made as directed, and signed approval by Medical Director. ? ?

## 2023-07-11 NOTE — Progress Notes (Signed)
 Cardiac Individual Treatment Plan  Patient Details  Name: Gerald Garza MRN: [id number] Date of Birth: [birth year] Referring Provider:   Flowsheet Row Cardiac Rehab from 06/06/2023 in [facility] Cardiac and Pulmonary Rehab  Referring Provider Dr. Randene Bustard       Initial Encounter Date:  Flowsheet Row Cardiac Rehab from 06/06/2023 in [facility] Cardiac and Pulmonary Rehab  Date 06/06/23       Visit Diagnosis: NSTEMI (non-ST elevated myocardial infarction) [facility])  Status post coronary artery stent placement  Patient's Home Medications on Admission:  Current Outpatient Medications:    amLODipine  (NORVASC ) 5 MG tablet, Take 1 tablet (5 mg total) by mouth daily., Disp: 90 tablet, Rfl: 3   aspirin  EC 81 MG tablet, Take 1 tablet (81 mg total) by mouth daily. Swallow whole., Disp: 30 tablet, Rfl: 12   fish oil-omega-3 fatty acids 1000 MG capsule, Take 2 g by mouth daily., Disp: , Rfl:    lisinopril -hydrochlorothiazide  (PRINZIDE ,ZESTORETIC ) 20-25 MG tablet, Take 1 tablet by mouth daily., Disp: , Rfl:    Magnesium 250 MG TABS, Take by mouth 2 (two) times daily., Disp: , Rfl:    metoprolol  succinate (TOPROL -XL) 25 MG 24 hr tablet, Take 0.5 tablets (12.5 mg total) by mouth daily., Disp: 30 tablet, Rfl: 3   Multiple Vitamin (MULTI-VITAMIN DAILY PO), Take by mouth., Disp: , Rfl:    prasugrel  (EFFIENT ) 10 MG TABS tablet, Take 1 tablet (10 mg total) by mouth daily., Disp: 90 tablet, Rfl: 3   rosuvastatin  (CRESTOR ) 20 MG tablet, Take 1 tablet (20 mg total) by mouth at bedtime., Disp: 90 tablet, Rfl: 3   vitamin B-12 (CYANOCOBALAMIN) 250 MCG tablet, Take 250 mcg by mouth daily., Disp: , Rfl:   Past Medical History: Past Medical History:  Diagnosis Date   Arthritis    CAD (coronary artery disease)    a. 05/2023 NSTEMI-->Myoview : high risk w/ septal/apical infarct/ischemia; b. 05/2023 Cath/PCI: LM nl, LAD 177m/d (2.5x22 Xience Frontier DES x 2), D2 50ost, LCX min irregs, RCA 20/20p-->12 mos DAPT followed by  SAPT rec.   Diastolic dysfunction    a. 05/2023 Echo: EF 55-60%, no rwma, GrI DD, nl RV fxn, RVSP 22.1 mmHg.   Headache    hx cluster headaches   Hyperlipidemia LDL goal <70    Hypertension    Pneumonia 2015   bacterial    Tobacco Use: Social History   Tobacco Use  Smoking Status Every Day   Current packs/day: 0.50   Average packs/day: 0.5 packs/day for 40.0 years (20.0 ttl pk-yrs)   Types: Cigarettes  Smokeless Tobacco Never    Labs: Review Flowsheet       Latest Ref Rng & Units 05/23/2023 07/02/2023  Labs for ITP Cardiac and Pulmonary Rehab  Cholestrol 100 - 199 mg/dL 578  469   LDL (calc) 0 - 99 mg/dL 85  58   HDL-C >62 mg/dL 41  47   Trlycerides 0 - 149 mg/dL 952  95      Exercise Target Goals: Exercise Program Goal: Individual exercise prescription set using results from initial 6 min walk test and THRR while considering  patient's activity barriers and safety.   Exercise Prescription Goal: Initial exercise prescription builds to 30-45 minutes a day of aerobic activity, 2-3 days per week.  Home exercise guidelines will be given to patient during program as part of exercise prescription that the participant will acknowledge.   Education: Aerobic Exercise: - Group verbal and visual presentation on the components of exercise prescription. Introduces  F.I.T.T principle from ACSM for exercise prescriptions.  Reviews F.I.T.T. principles of aerobic exercise including progression. Written material given at graduation.   Education: Resistance Exercise: - Group verbal and visual presentation on the components of exercise prescription. Introduces F.I.T.T principle from ACSM for exercise prescriptions  Reviews F.I.T.T. principles of resistance exercise including progression. Written material given at graduation.    Education: Exercise & Equipment Safety: - Individual verbal instruction and demonstration of equipment use and safety with use of the equipment. Flowsheet Row  Cardiac Rehab from 06/06/2023 in [facility] Cardiac and Pulmonary Rehab  Date 06/06/23  Educator [facility]  Instruction Review Code 1- Verbalizes Understanding       Education: Exercise Physiology & General Exercise Guidelines: - Group verbal and written instruction with models to review the exercise physiology of the cardiovascular system and associated critical values. Provides general exercise guidelines with specific guidelines to those with heart or lung disease.    Education: Flexibility, Balance, Mind/Body Relaxation: - Group verbal and visual presentation with interactive activity on the components of exercise prescription. Introduces F.I.T.T principle from ACSM for exercise prescriptions. Reviews F.I.T.T. principles of flexibility and balance exercise training including progression. Also discusses the mind body connection.  Reviews various relaxation techniques to help reduce and manage stress (i.e. Deep breathing, progressive muscle relaxation, and visualization). Balance handout provided to take home. Written material given at graduation.   Activity Barriers & Risk Stratification:  Activity Barriers & Cardiac Risk Stratification - 06/06/23 1205       Activity Barriers & Cardiac Risk Stratification   Activity Barriers Left Knee Replacement;Right Knee Replacement;Other (comment)    Comments Left shoulder replacement. No issues with shoulder replacement. No pain with knee replacements but does have some limitied ROM    Cardiac Risk Stratification Moderate             6 Minute Walk:  6 Minute Walk     Row Name 06/06/23 1202         6 Minute Walk   Phase Initial     Distance 1160 feet     Walk Time 6 minutes     # of Rest Breaks 0     MPH 2.2     METS 2.76     RPE 7     Perceived Dyspnea  0     VO2 Peak 9.66     Symptoms No     Resting HR 73 bpm     Resting BP 130/80     Resting Oxygen Saturation  96 %     Exercise Oxygen Saturation  during 6 min walk 96 %     Max Ex. HR  89 bpm     Max Ex. BP 150/80     2 Minute Post BP 124/80              Oxygen Initial Assessment:   Oxygen Re-Evaluation:   Oxygen Discharge (Final Oxygen Re-Evaluation):   Initial Exercise Prescription:  Initial Exercise Prescription - 06/06/23 1200       Date of Initial Exercise RX and Referring Provider   Date 06/06/23    Referring Provider Dr. Randene Bustard      Oxygen   Maintain Oxygen Saturation 88% or higher      Treadmill   MPH 2    Grade 1    Minutes 15    METs 2.81      REL-XR   Level 2    Speed 50    Minutes 15  METs 2.76      T5 Nustep   Level 2    SPM 80    Minutes 15    METs 2.76      Prescription Details   Frequency (times per week) 3    Duration Progress to 30 minutes of continuous aerobic without signs/symptoms of physical distress      Intensity   THRR 40-80% of Max Heartrate 105-138    Ratings of Perceived Exertion 11-13    Perceived Dyspnea 0-4      Progression   Progression Continue to progress workloads to maintain intensity without signs/symptoms of physical distress.      Resistance Training   Training Prescription Yes    Weight 5    Reps 10-15             Perform Capillary Blood Glucose checks as needed.  Exercise Prescription Changes:   Exercise Prescription Changes     Row Name 06/06/23 1200 06/28/23 1700           Response to Exercise   Blood Pressure (Admit) 130/80 122/76      Blood Pressure (Exercise) 150/80 162/86      Blood Pressure (Exit) 124/80 118/68      Heart Rate (Admit) 73 bpm 81 bpm      Heart Rate (Exercise) 89 bpm 118 bpm      Heart Rate (Exit) 75 bpm 97 bpm      Oxygen Saturation (Admit) 96 % --      Oxygen Saturation (Exercise) 96 % --      Oxygen Saturation (Exit) 97 % --      Rating of Perceived Exertion (Exercise) 7 13      Perceived Dyspnea (Exercise) 0 --      Symptoms none none      Comments 6 MWT results First 2 weeks of exercise sessions      Duration -- Continue with  30 min of aerobic exercise without signs/symptoms of physical distress.      Intensity -- THRR unchanged        Progression   Progression -- Continue to progress workloads to maintain intensity without signs/symptoms of physical distress.      Average METs -- 2.7        Resistance Training   Training Prescription -- Yes      Weight -- 5      Reps -- 10-15        Interval Training   Interval Training -- No        Treadmill   MPH -- 2      Grade -- 1      Minutes -- 15      METs -- 2.81        REL-XR   Level -- 2      Minutes -- 14      METs -- 3.5        T5 Nustep   Level -- 3      Minutes -- 15      METs -- 2.3        Biostep-RELP   Level -- 4      SPM -- 50      Minutes -- 15      METs -- 3        Oxygen   Maintain Oxygen Saturation -- 88% or higher               Exercise Comments:   Exercise Comments  Row Name 06/11/23 1733           Exercise Comments First full day of exercise!  Patient was oriented to gym and equipment including functions, settings, policies, and procedures.  Patient's individual exercise prescription and treatment plan were reviewed.  All starting workloads were established based on the results of the 6 minute walk test done at initial orientation visit.  The plan for exercise progression was also introduced and progression will be customized based on patient's performance and goals.                Exercise Goals and Review:   Exercise Goals     Row Name 06/06/23 1210             Exercise Goals   Increase Physical Activity Yes       Intervention Provide advice, education, support and counseling about physical activity/exercise needs.;Develop an individualized exercise prescription for aerobic and resistive training based on initial evaluation findings, risk stratification, comorbidities and participant's personal goals.       Expected Outcomes Short Term: Attend rehab on a regular basis to increase amount of physical  activity.;Long Term: Add in home exercise to make exercise part of routine and to increase amount of physical activity.;Long Term: Exercising regularly at least 3-5 days a week.       Increase Strength and Stamina Yes       Intervention Provide advice, education, support and counseling about physical activity/exercise needs.;Develop an individualized exercise prescription for aerobic and resistive training based on initial evaluation findings, risk stratification, comorbidities and participant's personal goals.       Expected Outcomes Short Term: Increase workloads from initial exercise prescription for resistance, speed, and METs.;Short Term: Perform resistance training exercises routinely during rehab and add in resistance training at home;Long Term: Improve cardiorespiratory fitness, muscular endurance and strength as measured by increased METs and functional capacity ( )       Able to understand and use rate of perceived exertion (RPE) scale Yes       Intervention Provide education and explanation on how to use RPE scale       Expected Outcomes Short Term: Able to use RPE daily in rehab to express subjective intensity level;Long Term:  Able to use RPE to guide intensity level when exercising independently       Able to understand and use Dyspnea scale Yes       Intervention Provide education and explanation on how to use Dyspnea scale       Expected Outcomes Short Term: Able to use Dyspnea scale daily in rehab to express subjective sense of shortness of breath during exertion;Long Term: Able to use Dyspnea scale to guide intensity level when exercising independently       Knowledge and understanding of Target Heart Rate Range (THRR) Yes       Intervention Provide education and explanation of THRR including how the numbers were predicted and where they are located for reference       Expected Outcomes Short Term: Able to state/look up THRR;Long Term: Able to use THRR to govern intensity when  exercising independently;Short Term: Able to use daily as guideline for intensity in rehab       Able to check pulse independently Yes       Intervention Provide education and demonstration on how to check pulse in carotid and radial arteries.;Review the importance of being able to check your own pulse for safety during independent exercise  Expected Outcomes Short Term: Able to explain why pulse checking is important during independent exercise;Long Term: Able to check pulse independently and accurately       Understanding of Exercise Prescription Yes       Intervention Provide education, explanation, and written materials on patient's individual exercise prescription       Expected Outcomes Short Term: Able to explain program exercise prescription;Long Term: Able to explain home exercise prescription to exercise independently                Exercise Goals Re-Evaluation :  Exercise Goals Re-Evaluation     Row Name 06/11/23 1733 06/28/23 1751           Exercise Goal Re-Evaluation   Exercise Goals Review Increase Physical Activity;Able to understand and use rate of perceived exertion (RPE) scale;Knowledge and understanding of Target Heart Rate Range (THRR);Understanding of Exercise Prescription;Increase Strength and Stamina;Able to check pulse independently Increase Physical Activity;Understanding of Exercise Prescription;Increase Strength and Stamina      Comments Reviewed RPE and dyspnea scale, THR and program prescription with pt today.  Pt voiced understanding and was given a copy of goals to take home. Joe is off to a good start in the program and finished his first 2 weeks in this review. He did the treadmill at a speed of 2. with 1% incline. He did level 2 on the XR and level 3 on the T5 nustep. He also added the biostep to his prescription at level 4. We will continue to monitor his progress in the program.      Expected Outcomes Short: Use RPE daily to regulate intensity.   Long: Follow program prescription in THR. Short: Continue to follow current exercise prescription. Long: Continue exercise to improve strength and stamina.               Discharge Exercise Prescription (Final Exercise Prescription Changes):  Exercise Prescription Changes - 06/28/23 1700       Response to Exercise   Blood Pressure (Admit) 122/76    Blood Pressure (Exercise) 162/86    Blood Pressure (Exit) 118/68    Heart Rate (Admit) 81 bpm    Heart Rate (Exercise) 118 bpm    Heart Rate (Exit) 97 bpm    Rating of Perceived Exertion (Exercise) 13    Symptoms none    Comments First 2 weeks of exercise sessions    Duration Continue with 30 min of aerobic exercise without signs/symptoms of physical distress.    Intensity THRR unchanged      Progression   Progression Continue to progress workloads to maintain intensity without signs/symptoms of physical distress.    Average METs 2.7      Resistance Training   Training Prescription Yes    Weight 5    Reps 10-15      Interval Training   Interval Training No      Treadmill   MPH 2    Grade 1    Minutes 15    METs 2.81      REL-XR   Level 2    Minutes 14    METs 3.5      T5 Nustep   Level 3    Minutes 15    METs 2.3      Biostep-RELP   Level 4    SPM 50    Minutes 15    METs 3      Oxygen   Maintain Oxygen Saturation 88% or higher  Nutrition:  Target Goals: Understanding of nutrition guidelines, daily intake of sodium 1500mg , cholesterol 200mg , calories 30% from fat and 7% or less from saturated fats, daily to have 5 or more servings of fruits and vegetables.  Education: All About Nutrition: -Group instruction provided by verbal, written material, interactive activities, discussions, models, and posters to present general guidelines for heart healthy nutrition including fat, fiber, MyPlate, the role of sodium in heart healthy nutrition, utilization of the nutrition label, and utilization of  this knowledge for meal planning. Follow up email sent as well. Written material given at graduation.   Biometrics:  Pre Biometrics - 06/06/23 1211       Pre Biometrics   Height 5' 11.5" (1.816 m)    Weight 228 lb 1.6 oz (103.5 kg)    Waist Circumference 44 inches    Hip Circumference 44.5 inches    Waist to Hip Ratio 0.99 %    BMI (Calculated) 31.37    Single Leg Stand 15.68 seconds              Nutrition Therapy Plan and Nutrition Goals:  Nutrition Therapy & Goals - 06/18/23 1732       Nutrition Therapy   Diet Cardiac, Low Na    Protein (specify units) 80-100    Fiber 30 grams    Whole Grain Foods 3 servings    Saturated Fats 15 max. grams    Fruits and Vegetables 5 servings/day    Sodium 2 grams      Personal Nutrition Goals   Nutrition Goal Reduce saturated fat, less than 12g per day. Replace bad fats for more heart healthy fats.    Personal Goal #2 Read labels and reduce sodium intake to below 2300mg . Ideally 1500mg  per day.    Personal Goal #3 Eat 15-30gProtein and 30-60gCarbs at each meal.    Comments Patient drinking lots of water when at work. But when he is home, he drinks lots of milk. Says he has been going through 2 gallons per week. Drinks some lite beers as well. Spoke with him about reducing fat content of milk, he says he used to drink whole milk but has cut down to 2%. Commended him on this change and encouraged him to continue to reduce fat percentage or reduce how much he is drinking. He will try to drink 1 gallon per week. Educated on identifying nutrients of concern like saturated fat and sodium. Provided guideline limits to sodium and saturated fat, 1500mg  and 15gSat fat respectively. Talked with him about his diet, sodium will need to be monitored and encouraged him to read labels to prevent getting surprised my foods that have way too much sodium. He also likes a lot of saturated rich foods. He may need to work on decreasing his saturated fat intake  more. Encouraged and supported him on this journey to make better changes. Reviewed mediterranean diet handout. Educated on types of fats, sources, and how to read labels. Together, looked at several facts labels of foods he likes. Walked though label evaluations with aim to better his understanding and confidence in reading labels independently. Brainstormed several meals and snacks with foods he likes and will eat.      Intervention Plan   Intervention Prescribe, educate and counsel regarding individualized specific dietary modifications aiming towards targeted core components such as weight, hypertension, lipid management, diabetes, heart failure and other comorbidities.;Nutrition handout(s) given to patient.    Expected Outcomes Short Term Goal: Understand basic principles of dietary content,  such as calories, fat, sodium, cholesterol and nutrients.;Short Term Goal: A plan has been developed with personal nutrition goals set during dietitian appointment.;Long Term Goal: Adherence to prescribed nutrition plan.             Nutrition Assessments:  MEDIFICTS Score Key: >=70 Need to make dietary changes  40-70 Heart Healthy Diet <= 40 Therapeutic Level Cholesterol Diet  Flowsheet Row Cardiac Rehab from 06/11/2023 in [facility] Cardiac and Pulmonary Rehab  Picture Your Plate Total Score on Admission 50      Picture Your Plate Scores: <16 Unhealthy dietary pattern with much room for improvement. 41-50 Dietary pattern unlikely to meet recommendations for good health and room for improvement. 51-60 More healthful dietary pattern, with some room for improvement.  >60 Healthy dietary pattern, although there may be some specific behaviors that could be improved.    Nutrition Goals Re-Evaluation:   Nutrition Goals Discharge (Final Nutrition Goals Re-Evaluation):   Psychosocial: Target Goals: Acknowledge presence or absence of significant depression and/or stress, maximize coping skills, provide  positive support system. Participant is able to verbalize types and ability to use techniques and skills needed for reducing stress and depression.   Education: Stress, Anxiety, and Depression - Group verbal and visual presentation to define topics covered.  Reviews how body is impacted by stress, anxiety, and depression.  Also discusses healthy ways to reduce stress and to treat/manage anxiety and depression.  Written material given at graduation.   Education: Sleep Hygiene -Provides group verbal and written instruction about how sleep can affect your health.  Define sleep hygiene, discuss sleep cycles and impact of sleep habits. Review good sleep hygiene tips.    Initial Review & Psychosocial Screening:  Initial Psych Review & Screening - 05/30/23 1346       Initial Review   Current issues with None Identified      Family Dynamics   Good Support System? Yes    Comments Asa Lauth has his wife and 4 dogs to look to for support. He does not take anything for his mood and states no mental instability.      Barriers   Psychosocial barriers to participate in program There are no identifiable barriers or psychosocial needs.;The patient should benefit from training in stress management and relaxation.      Screening Interventions   Interventions Encouraged to exercise;To provide support and resources with identified psychosocial needs;Provide feedback about the scores to participant    Expected Outcomes Short Term goal: Utilizing psychosocial counselor, staff and physician to assist with identification of specific Stressors or current issues interfering with healing process. Setting desired goal for each stressor or current issue identified.;Long Term Goal: Stressors or current issues are controlled or eliminated.;Short Term goal: Identification and review with participant of any Quality of Life or Depression concerns found by scoring the questionnaire.;Long Term goal: The participant improves quality  of Life and PHQ9 Scores as seen by post scores and/or verbalization of changes             Quality of Life Scores:   Quality of Life - 06/11/23 1747       Quality of Life   Select Quality of Life      Quality of Life Scores   Health/Function Pre 26.57 %    Socioeconomic Pre 29.64 %    Psych/Spiritual Pre 30 %    Family Pre 30 %    GLOBAL Pre 28.37 %            Scores  of 19 and below usually indicate a poorer quality of life in these areas.  A difference of  2-3 points is a clinically meaningful difference.  A difference of 2-3 points in the total score of the Quality of Life Index has been associated with significant improvement in overall quality of life, self-image, physical symptoms, and general health in studies assessing change in quality of life.  PHQ-9: Review Flowsheet       06/06/2023  Depression screen PHQ 2/9  Decreased Interest 0  Down, Depressed, Hopeless 0  PHQ - 2 Score 0  Altered sleeping 0  Tired, decreased energy 1  Change in appetite 0  Feeling bad or failure about yourself  0  Trouble concentrating 0  Moving slowly or fidgety/restless 0  Suicidal thoughts 0  PHQ-9 Score 1  Difficult doing work/chores Not difficult at all   Interpretation of Total Score  Total Score Depression Severity:  1-4 = Minimal depression, 5-9 = Mild depression, 10-14 = Moderate depression, 15-19 = Moderately severe depression, 20-27 = Severe depression   Psychosocial Evaluation and Intervention:  Psychosocial Evaluation - 05/30/23 1348       Psychosocial Evaluation & Interventions   Interventions Encouraged to exercise with the program and follow exercise prescription;Relaxation education;Stress management education    Comments Asa Lauth has his wife and 4 dogs to look to for support. He does not take anything for his mood and states no mental instability.    Expected Outcomes Short: Start [facility] to help with mood. Long: Maintain a healthy mental state    Continue  Psychosocial Services  Follow up required by staff             Psychosocial Re-Evaluation:   Psychosocial Discharge (Final Psychosocial Re-Evaluation):   Vocational Rehabilitation: Provide vocational rehab assistance to qualifying candidates.   Vocational Rehab Evaluation & Intervention:   Education: Education Goals: Education classes will be provided on a variety of topics geared toward better understanding of heart health and risk factor modification. Participant will state understanding/return demonstration of topics presented as noted by education test scores.  Learning Barriers/Preferences:  Learning Barriers/Preferences - 05/30/23 1344       Learning Barriers/Preferences   Learning Barriers None    Learning Preferences None             General Cardiac Education Topics:  AED/CPR: - Group verbal and written instruction with the use of models to demonstrate the basic use of the AED with the basic ABC's of resuscitation.   Anatomy and Cardiac Procedures: - Group verbal and visual presentation and models provide information about basic cardiac anatomy and function. Reviews the testing methods done to diagnose heart disease and the outcomes of the test results. Describes the treatment choices: Medical Management, Angioplasty, or Coronary Bypass Surgery for treating various heart conditions including Myocardial Infarction, Angina, Valve Disease, and Cardiac Arrhythmias.  Written material given at graduation.   Medication Safety: - Group verbal and visual instruction to review commonly prescribed medications for heart and lung disease. Reviews the medication, class of the drug, and side effects. Includes the steps to properly store meds and maintain the prescription regimen.  Written material given at graduation.   Intimacy: - Group verbal instruction through game format to discuss how heart and lung disease can affect sexual intimacy. Written material given at  graduation..   Know Your Numbers and Heart Failure: - Group verbal and visual instruction to discuss disease risk factors for cardiac and pulmonary disease and treatment options.  Reviews associated critical values for Overweight/Obesity, Hypertension, Cholesterol, and Diabetes.  Discusses basics of heart failure: signs/symptoms and treatments.  Introduces Heart Failure Zone chart for action plan for heart failure.  Written material given at graduation.   Infection Prevention: - Provides verbal and written material to individual with discussion of infection control including proper hand washing and proper equipment cleaning during exercise session. Flowsheet Row Cardiac Rehab from 06/06/2023 in [facility] Cardiac and Pulmonary Rehab  Date 06/06/23  Educator [facility]  Instruction Review Code 1- Verbalizes Understanding       Falls Prevention: - Provides verbal and written material to individual with discussion of falls prevention and safety. Flowsheet Row Cardiac Rehab from 06/06/2023 in [facility] Cardiac and Pulmonary Rehab  Date 06/06/23  Educator [facility]  Instruction Review Code 1- Verbalizes Understanding       Other: -Provides group and verbal instruction on various topics (see comments)   Knowledge Questionnaire Score:  Knowledge Questionnaire Score - 06/11/23 1747       Knowledge Questionnaire Score   Pre Score 21/26             Core Components/Risk Factors/Patient Goals at Admission:  Personal Goals and Risk Factors at Admission - 06/06/23 1212       Core Components/Risk Factors/Patient Goals on Admission   Intervention Weight Management: Develop a combined nutrition and exercise program designed to reach desired caloric intake, while maintaining appropriate intake of nutrient and fiber, sodium and fats, and appropriate energy expenditure required for the weight goal.;Weight Management: Provide education and appropriate resources to help participant work on and attain dietary  goals.;Weight Management/Obesity: Establish reasonable short term and long term weight goals.;Obesity: Provide education and appropriate resources to help participant work on and attain dietary goals.    Expected Outcomes Short Term: Continue to assess and modify interventions until short term weight is achieved;Weight Loss: Understanding of general recommendations for a balanced deficit meal plan, which promotes 1-2 lb weight loss per week and includes a negative energy balance of [phone] kcal/d;Understanding recommendations for meals to include 15-35% energy as protein, 25-35% energy from fat, 35-60% energy from carbohydrates, less than 200mg  of dietary cholesterol, 20-35 gm of total fiber daily;Understanding of distribution of calorie intake throughout the day with the consumption of 4-5 meals/snacks;Long Term: Adherence to nutrition and physical activity/exercise program aimed toward attainment of established weight goal    Tobacco Cessation Yes    Number of packs per day .2    Intervention Assist the participant in steps to quit. Provide individualized education and counseling about committing to Tobacco Cessation, relapse prevention, and pharmacological support that can be provided by physician.;Education officer, environmental, assist with locating and accessing local/national Quit Smoking programs, and support quit date choice.    Expected Outcomes Short Term: Will demonstrate readiness to quit, by selecting a quit date.;Short Term: Will quit all tobacco product use, adhering to prevention of relapse plan.;Long Term: Complete abstinence from all tobacco products for at least 12 months from quit date.    Hypertension Yes    Intervention Provide education on lifestyle modifcations including regular physical activity/exercise, weight management, moderate sodium restriction and increased consumption of fresh fruit, vegetables, and low fat dairy, alcohol moderation, and smoking cessation.;Monitor  prescription use compliance.    Expected Outcomes Short Term: Continued assessment and intervention until BP is < 140/25mm HG in hypertensive participants. < 130/31mm HG in hypertensive participants with diabetes, heart failure or chronic kidney disease.;Long Term: Maintenance of blood pressure at goal levels.  Lipids Yes    Intervention Provide education and support for participant on nutrition & aerobic/resistive exercise along with prescribed medications to achieve LDL 70mg , HDL >40mg .    Expected Outcomes Short Term: Participant states understanding of desired cholesterol values and is compliant with medications prescribed. Participant is following exercise prescription and nutrition guidelines.;Long Term: Cholesterol controlled with medications as prescribed, with individualized exercise RX and with personalized nutrition plan. Value goals: LDL < 70mg , HDL > 40 mg.             Education:Diabetes - Individual verbal and written instruction to review signs/symptoms of diabetes, desired ranges of glucose level fasting, after meals and with exercise. Acknowledge that pre and post exercise glucose checks will be done for 3 sessions at entry of program.   Core Components/Risk Factors/Patient Goals Review:   Goals and Risk Factor Review     Row Name 06/06/23 1213             Core Components/Risk Factors/Patient Goals Review   Personal Goals Review Tobacco Cessation       Review Santford Sobalvarro is a current tobacco user. Intervention for tobacco cessation was provided at the initial medical review. He} was asked about readiness to quit and reported he has cut backto3-4 cigarettes a day and does not have a quit date yet but is planning to continue to cut back and eventually quit . Patient was advised and educated about tobacco cessation using combination therapy, tobacco cessation classes, quit line, and quit smoking apps. Patient demonstrated understanding of this material. Staff will  continue to provide encouragement and follow up with the patient throughout the program       Expected Outcomes Short: continue to cut back and pick a quit  date. Long: become tobacco free                Core Components/Risk Factors/Patient Goals at Discharge (Final Review):   Goals and Risk Factor Review - 06/06/23 1213       Core Components/Risk Factors/Patient Goals Review   Personal Goals Review Tobacco Cessation    Review Chais Berger is a current tobacco user. Intervention for tobacco cessation was provided at the initial medical review. He} was asked about readiness to quit and reported he has cut backto3-4 cigarettes a day and does not have a quit date yet but is planning to continue to cut back and eventually quit . Patient was advised and educated about tobacco cessation using combination therapy, tobacco cessation classes, quit line, and quit smoking apps. Patient demonstrated understanding of this material. Staff will continue to provide encouragement and follow up with the patient throughout the program    Expected Outcomes Short: continue to cut back and pick a quit  date. Long: become tobacco free             ITP Comments:  ITP Comments     Row Name 05/30/23 1343 06/06/23 1200 06/11/23 1732 06/13/23 1137 07/11/23 1316   ITP Comments Virtual Visit completed. Patient informed on EP and RD appointment and 6 Minute walk test. Patient also informed of patient health questionnaires on My Chart. Patient Verbalizes understanding. Visit diagnosis can [facility] found in [facility]/23[facility]. Completed and gym orientation. Initial ITP created and sent for review to Dr. Firman Hughes, Medical Director. Amando Masser is a current tobacco user. Intervention for tobacco cessation was provided at the initial medical review. He} was asked about readiness to quit and reported he has cut backto3-4 cigarettes a day  and does not have a quit date yet but is planning to continue to cut back and  eventually quit . Patient was advised and educated about tobacco cessation using combination therapy, tobacco cessation classes, quit line, and quit smoking apps. Patient demonstrated understanding of this material. Staff will continue to provide encouragement and follow up with the patient throughout the program. First full day of exercise!  Patient was oriented to gym and equipment including functions, settings, policies, and procedures.  Patient's individual exercise prescription and treatment plan were reviewed.  All starting workloads were established based on the results of the 6 minute walk test done at initial orientation visit.  The plan for exercise progression was also introduced and progression will be customized based on patient's performance and goals. 30 Day review completed. Medical Director ITP review done, changes made as directed, and signed approval by Medical Director.   new to prgram 30 Day review completed. Medical Director ITP review done, changes made as directed, and signed approval by Medical Director.            Comments: 30 day review

## 2023-07-12 ENCOUNTER — Encounter: Admitting: *Deleted

## 2023-07-12 DIAGNOSIS — I214 Non-ST elevation (NSTEMI) myocardial infarction: Secondary | ICD-10-CM | POA: Diagnosis not present

## 2023-07-12 DIAGNOSIS — Z955 Presence of coronary angioplasty implant and graft: Secondary | ICD-10-CM | POA: Diagnosis not present

## 2023-07-12 NOTE — Progress Notes (Signed)
 Daily Session Note  Patient Details  Name: Gerald Garza MRN: [id number] Date of Birth: November 24, 1960 Referring Provider:   Flowsheet Row Cardiac Rehab from 06/06/2023 in [facility] Cardiac and Pulmonary Rehab  Referring Provider Dr. Randene Bustard       Encounter Date: 07/12/2023  Check In:  Session Check In - 07/12/23 1721       Check-In   Supervising physician immediately available to respond to emergencies See telemetry face sheet for immediately available ER MD    Location [facility]-Cardiac & Pulmonary Rehab    Staff Present Sue Em RN,BSN;[facility] BS, Exercise Physiologist    Virtual Visit No    Medication changes reported     No    Fall or balance concerns reported    No    Tobacco Cessation Use Increase    Current number of cigarettes/nicotine per day     4    Warm-up and Cool-down Performed on first and last piece of equipment    Resistance Training Performed Yes    VAD Patient? No    PAD/SET Patient? No      Pain Assessment   Currently in Pain? No/denies                Social History   Tobacco Use  Smoking Status Every Day   Current packs/day: 0.50   Average packs/day: 0.5 packs/day for 40.0 years (20.0 ttl pk-yrs)   Types: Cigarettes  Smokeless Tobacco Never    Goals Met:  Independence with exercise equipment Exercise tolerated well No report of concerns or symptoms today Strength training completed today  Goals Unmet:  Not Applicable  Comments: Pt able to follow exercise prescription today without complaint.  Will continue to monitor for progression.    Dr. Firman Hughes is Medical Director for [facility] Cardiac Rehabilitation.  Dr. Fuad Aleskerov is Medical Director for [facility] Pulmonary Rehabilitation.

## 2023-07-16 ENCOUNTER — Encounter

## 2023-07-16 DIAGNOSIS — Z79899 Other long term (current) drug therapy: Secondary | ICD-10-CM | POA: Diagnosis not present

## 2023-07-16 DIAGNOSIS — E7841 Elevated Lipoprotein(a): Secondary | ICD-10-CM | POA: Diagnosis not present

## 2023-07-16 DIAGNOSIS — I10 Essential (primary) hypertension: Secondary | ICD-10-CM | POA: Diagnosis not present

## 2023-07-16 DIAGNOSIS — Z0000 Encounter for general adult medical examination without abnormal findings: Secondary | ICD-10-CM | POA: Diagnosis not present

## 2023-07-16 DIAGNOSIS — F17210 Nicotine dependence, cigarettes, uncomplicated: Secondary | ICD-10-CM | POA: Diagnosis not present

## 2023-07-16 DIAGNOSIS — I2510 Atherosclerotic heart disease of native coronary artery without angina pectoris: Secondary | ICD-10-CM | POA: Diagnosis not present

## 2023-07-18 ENCOUNTER — Encounter: Admitting: *Deleted

## 2023-07-18 DIAGNOSIS — Z955 Presence of coronary angioplasty implant and graft: Secondary | ICD-10-CM | POA: Diagnosis not present

## 2023-07-18 DIAGNOSIS — I214 Non-ST elevation (NSTEMI) myocardial infarction: Secondary | ICD-10-CM | POA: Diagnosis not present

## 2023-07-18 NOTE — Progress Notes (Signed)
 Daily Session Note  Patient Details  Name: Gerald Garza MRN: [id number] Date of Birth: Jan 03, 1961 Referring Provider:   Flowsheet Row Cardiac Rehab from 06/06/2023 in [facility] Cardiac and Pulmonary Rehab  Referring Provider Dr. Randene Bustard       Encounter Date: 07/18/2023  Check In:  Session Check In - 07/18/23 1720       Check-In   Supervising physician immediately available to respond to emergencies See telemetry face sheet for immediately available ER MD    Location [facility]-Cardiac & Pulmonary Rehab    Staff Present Sue Em RN,BSN;[facility] BS, ACSM CEP, Exercise Physiologist    Virtual Visit No    Medication changes reported     No    Fall or balance concerns reported    No    Tobacco Cessation No Change    Warm-up and Cool-down Performed on first and last piece of equipment    Resistance Training Performed Yes    VAD Patient? No    PAD/SET Patient? No      Pain Assessment   Currently in Pain? No/denies                Social History   Tobacco Use  Smoking Status Every Day   Current packs/day: 0.50   Average packs/day: 0.5 packs/day for 40.0 years (20.0 ttl pk-yrs)   Types: Cigarettes  Smokeless Tobacco Never    Goals Met:  Independence with exercise equipment Exercise tolerated well No report of concerns or symptoms today Strength training completed today  Goals Unmet:  Not Applicable  Comments: Pt able to follow exercise prescription today without complaint.  Will continue to monitor for progression.    Dr. Firman Hughes is Medical Director for [facility] Cardiac Rehabilitation.  Dr. Fuad Aleskerov is Medical Director for [facility] Pulmonary Rehabilitation.

## 2023-07-19 ENCOUNTER — Encounter: Admitting: *Deleted

## 2023-07-19 DIAGNOSIS — I214 Non-ST elevation (NSTEMI) myocardial infarction: Secondary | ICD-10-CM

## 2023-07-19 DIAGNOSIS — Z955 Presence of coronary angioplasty implant and graft: Secondary | ICD-10-CM | POA: Diagnosis not present

## 2023-07-19 NOTE — Progress Notes (Signed)
 Daily Session Note  Patient Details  Name: Gerald Garza MRN: [id number] Date of Birth: 09/13/1960 Referring Provider:   Flowsheet Row Cardiac Rehab from 06/06/2023 in [facility] Cardiac and Pulmonary Rehab  Referring Provider Dr. Randene Bustard       Encounter Date: 07/19/2023  Check In:  Session Check In - 07/19/23 1710       Check-In   Supervising physician immediately available to respond to emergencies See telemetry face sheet for immediately available ER MD    Location [facility]-Cardiac & Pulmonary Rehab    Staff Present Sue Em RN,BSN;[facility] BS, Exercise Physiologist    Virtual Visit No    Medication changes reported     No    Fall or balance concerns reported    No    Warm-up and Cool-down Performed on first and last piece of equipment    Resistance Training Performed Yes    VAD Patient? No    PAD/SET Patient? No      Pain Assessment   Currently in Pain? No/denies                Social History   Tobacco Use  Smoking Status Every Day   Current packs/day: 0.50   Average packs/day: 0.5 packs/day for 40.0 years (20.0 ttl pk-yrs)   Types: Cigarettes  Smokeless Tobacco Never    Goals Met:  Independence with exercise equipment Exercise tolerated well No report of concerns or symptoms today Strength training completed today  Goals Unmet:  Not Applicable  Comments: Pt able to follow exercise prescription today without complaint.  Will continue to monitor for progression.    Dr. Firman Hughes is Medical Director for [facility] Cardiac Rehabilitation.  Dr. Fuad Aleskerov is Medical Director for [facility] Pulmonary Rehabilitation.

## 2023-07-23 ENCOUNTER — Encounter

## 2023-07-25 ENCOUNTER — Encounter: Admitting: *Deleted

## 2023-07-25 DIAGNOSIS — Z955 Presence of coronary angioplasty implant and graft: Secondary | ICD-10-CM | POA: Diagnosis not present

## 2023-07-25 DIAGNOSIS — I214 Non-ST elevation (NSTEMI) myocardial infarction: Secondary | ICD-10-CM

## 2023-07-25 NOTE — Progress Notes (Signed)
 Daily Session Note  Patient Details  Name: Gerald Garza MRN: [id number] Date of Birth: 09/19/1960 Referring Provider:   Flowsheet Row Cardiac Rehab from 06/06/2023 in [facility] Cardiac and Pulmonary Rehab  Referring Provider Dr. Randene Bustard       Encounter Date: 07/25/2023  Check In:  Session Check In - 07/25/23 1724       Check-In   Supervising physician immediately available to respond to emergencies See telemetry face sheet for immediately available ER MD    Location [facility]-Cardiac & Pulmonary Rehab    Staff Present Maud Sorenson, RN, BSN, CCRP;Margaret Best, MS, Exercise Physiologist;Kelly Dawne Euler, ACSM CEP, Exercise Physiologist    Virtual Visit No    Medication changes reported     No    Fall or balance concerns reported    No    Warm-up and Cool-down Performed on first and last piece of equipment    Resistance Training Performed Yes    VAD Patient? No    PAD/SET Patient? No      Pain Assessment   Currently in Pain? No/denies                Social History   Tobacco Use  Smoking Status Every Day   Current packs/day: 0.50   Average packs/day: 0.5 packs/day for 40.0 years (20.0 ttl pk-yrs)   Types: Cigarettes  Smokeless Tobacco Never    Goals Met:  Independence with exercise equipment Exercise tolerated well No report of concerns or symptoms today  Goals Unmet:  Not Applicable  Comments: Pt able to follow exercise prescription today without complaint.  Will continue to monitor for progression.   Reviewed home exercise with pt today.  Pt plans to join the [facility]  for exercise.  Reviewed THR, pulse, RPE, sign and symptoms, pulse oximetery and when to call 911 or MD.  Also discussed weather considerations and indoor options.  Pt voiced understanding.    Dr. Firman Hughes is Medical Director for [facility] Cardiac Rehabilitation.  Dr. Fuad Aleskerov is Medical Director for [facility] Pulmonary Rehabilitation.

## 2023-07-26 ENCOUNTER — Encounter: Admitting: *Deleted

## 2023-07-26 DIAGNOSIS — Z955 Presence of coronary angioplasty implant and graft: Secondary | ICD-10-CM | POA: Diagnosis not present

## 2023-07-26 DIAGNOSIS — I214 Non-ST elevation (NSTEMI) myocardial infarction: Secondary | ICD-10-CM

## 2023-07-26 NOTE — Progress Notes (Signed)
 Daily Session Note  Patient Details  Name: Gerald Garza MRN: [id number] Date of Birth: August 11, 1960 Referring Provider:   Flowsheet Row Cardiac Rehab from 06/06/2023 in [facility] Cardiac and Pulmonary Rehab  Referring Provider Dr. Randene Bustard       Encounter Date: 07/26/2023  Check In:  Session Check In - 07/26/23 1725       Check-In   Supervising physician immediately available to respond to emergencies See telemetry face sheet for immediately available ER MD    Location [facility]-Cardiac & Pulmonary Rehab    Staff Present Sue Em RN,BSN;[facility] BS, Exercise Physiologist    Virtual Visit No    Medication changes reported     No    Fall or balance concerns reported    No    Warm-up and Cool-down Performed on first and last piece of equipment    Resistance Training Performed Yes    VAD Patient? No    PAD/SET Patient? No      Pain Assessment   Currently in Pain? No/denies                Social History   Tobacco Use  Smoking Status Every Day   Current packs/day: 0.50   Average packs/day: 0.5 packs/day for 40.0 years (20.0 ttl pk-yrs)   Types: Cigarettes  Smokeless Tobacco Never    Goals Met:  Independence with exercise equipment Exercise tolerated well No report of concerns or symptoms today Strength training completed today  Goals Unmet:  Not Applicable  Comments: Pt able to follow exercise prescription today without complaint.  Will continue to monitor for progression.    Dr. Firman Hughes is Medical Director for [facility] Cardiac Rehabilitation.  Dr. Fuad Aleskerov is Medical Director for [facility] Pulmonary Rehabilitation.

## 2023-08-01 ENCOUNTER — Encounter: Admitting: *Deleted

## 2023-08-01 DIAGNOSIS — Z955 Presence of coronary angioplasty implant and graft: Secondary | ICD-10-CM

## 2023-08-01 DIAGNOSIS — I214 Non-ST elevation (NSTEMI) myocardial infarction: Secondary | ICD-10-CM

## 2023-08-01 NOTE — Progress Notes (Signed)
 Daily Session Note  Patient Details  Name: Gerald Garza MRN: [id number] Date of Birth: [birth year] Referring Provider:   Flowsheet Row Cardiac Rehab from 06/06/2023 in [facility] Cardiac and Pulmonary Rehab  Referring Provider Dr. Randene Bustard       Encounter Date: 08/01/2023  Check In:  Session Check In - 08/01/23 1721       Check-In   Supervising physician immediately available to respond to emergencies See telemetry face sheet for immediately available ER MD    Location [facility]-Cardiac & Pulmonary Rehab    Staff Present Sue Em RN,BSN;[facility] [place] BS, ACSM CEP, Exercise Physiologist    Virtual Visit No    Medication changes reported     No    Fall or balance concerns reported    No    Tobacco Cessation No Change    Current number of cigarettes/nicotine per day     4    Warm-up and Cool-down Performed on first and last piece of equipment    Resistance Training Performed Yes    VAD Patient? No    PAD/SET Patient? No      Pain Assessment   Currently in Pain? No/denies                Social History   Tobacco Use  Smoking Status Every Day   Current packs/day: 0.50   Average packs/day: 0.5 packs/day for 40.0 years (20.0 ttl pk-yrs)   Types: Cigarettes  Smokeless Tobacco Never    Goals Met:  Independence with exercise equipment Exercise tolerated well No report of concerns or symptoms today Strength training completed today  Goals Unmet:  Not Applicable  Comments: Pt able to follow exercise prescription today without complaint.  Will continue to monitor for progression.    Dr. Firman Hughes is Medical Director for [facility] Cardiac Rehabilitation.  Dr. Fuad Aleskerov is Medical Director for [facility] Pulmonary Rehabilitation.

## 2023-08-02 ENCOUNTER — Encounter

## 2023-08-06 ENCOUNTER — Encounter: Attending: Cardiology | Admitting: *Deleted

## 2023-08-06 DIAGNOSIS — I252 Old myocardial infarction: Secondary | ICD-10-CM | POA: Insufficient documentation

## 2023-08-06 DIAGNOSIS — F17210 Nicotine dependence, cigarettes, uncomplicated: Secondary | ICD-10-CM | POA: Diagnosis not present

## 2023-08-06 DIAGNOSIS — Z48812 Encounter for surgical aftercare following surgery on the circulatory system: Secondary | ICD-10-CM | POA: Diagnosis not present

## 2023-08-06 DIAGNOSIS — Z955 Presence of coronary angioplasty implant and graft: Secondary | ICD-10-CM | POA: Diagnosis not present

## 2023-08-06 DIAGNOSIS — I214 Non-ST elevation (NSTEMI) myocardial infarction: Secondary | ICD-10-CM

## 2023-08-06 NOTE — Progress Notes (Signed)
 Daily Session Note  Patient Details  Name: Gerald Garza MRN: [id number] Date of Birth: 1960/05/09 Referring Provider:   Flowsheet Row Cardiac Rehab from 06/06/2023 in [facility] Cardiac and Pulmonary Rehab  Referring Provider Dr. Randene Bustard       Encounter Date: 08/06/2023  Check In:  Session Check In - 08/06/23 1733       Check-In   Supervising physician immediately available to respond to emergencies See telemetry face sheet for immediately available ER MD    Location [facility]-Cardiac & Pulmonary Rehab    Staff Present Maud Sorenson, RN, BSN, CCRP;Kelly Hayes BS, ACSM CEP, Exercise Physiologist;Carston Hood RCP,RRT,BSRT    Virtual Visit No    Medication changes reported     No    Fall or balance concerns reported    No    Warm-up and Cool-down Performed on first and last piece of equipment    Resistance Training Performed Yes    VAD Patient? No    PAD/SET Patient? No      Pain Assessment   Currently in Pain? No/denies                Social History   Tobacco Use  Smoking Status Every Day   Current packs/day: 0.50   Average packs/day: 0.5 packs/day for 40.0 years (20.0 ttl pk-yrs)   Types: Cigarettes  Smokeless Tobacco Never    Goals Met:  Independence with exercise equipment Exercise tolerated well No report of concerns or symptoms today  Goals Unmet:  Not Applicable  Comments: Pt able to follow exercise prescription today without complaint.  Will continue to monitor for progression.    Dr. Firman Hughes is Medical Director for [facility] Cardiac Rehabilitation.  Dr. Fuad Aleskerov is Medical Director for [facility] Pulmonary Rehabilitation.

## 2023-08-08 ENCOUNTER — Encounter: Admitting: *Deleted

## 2023-08-08 ENCOUNTER — Encounter: Payer: Self-pay | Admitting: *Deleted

## 2023-08-08 DIAGNOSIS — I214 Non-ST elevation (NSTEMI) myocardial infarction: Secondary | ICD-10-CM

## 2023-08-08 DIAGNOSIS — Z955 Presence of coronary angioplasty implant and graft: Secondary | ICD-10-CM

## 2023-08-08 DIAGNOSIS — Z48812 Encounter for surgical aftercare following surgery on the circulatory system: Secondary | ICD-10-CM | POA: Diagnosis not present

## 2023-08-08 DIAGNOSIS — F17210 Nicotine dependence, cigarettes, uncomplicated: Secondary | ICD-10-CM | POA: Diagnosis not present

## 2023-08-08 DIAGNOSIS — I252 Old myocardial infarction: Secondary | ICD-10-CM | POA: Diagnosis not present

## 2023-08-08 NOTE — Progress Notes (Signed)
 Cardiac Individual Treatment Plan  Patient Details  Name: Gerald Garza MRN: [id number] Date of Birth: Sep 22, 1960 Referring Provider:   Flowsheet Row Cardiac Rehab from 06/06/2023 in [facility] Cardiac and Pulmonary Rehab  Referring Provider Dr. Randene Bustard       Initial Encounter Date:  Flowsheet Row Cardiac Rehab from 06/06/2023 in [facility] Cardiac and Pulmonary Rehab  Date 06/06/23       Visit Diagnosis: NSTEMI (non-ST elevated myocardial infarction) [facility])  Status post coronary artery stent placement  Patient's Home Medications on Admission:  Current Outpatient Medications:    amLODipine  (NORVASC ) 5 MG tablet, Take 1 tablet (5 mg total) by mouth daily., Disp: 90 tablet, Rfl: 3   aspirin  EC 81 MG tablet, Take 1 tablet (81 mg total) by mouth daily. Swallow whole., Disp: 30 tablet, Rfl: 12   fish oil-omega-3 fatty acids 1000 MG capsule, Take 2 g by mouth daily., Disp: , Rfl:    lisinopril -hydrochlorothiazide  (PRINZIDE ,ZESTORETIC ) 20-25 MG tablet, Take 1 tablet by mouth daily., Disp: , Rfl:    Magnesium 250 MG TABS, Take by mouth 2 (two) times daily., Disp: , Rfl:    metoprolol  succinate (TOPROL -XL) 25 MG 24 hr tablet, Take 0.5 tablets (12.5 mg total) by mouth daily., Disp: 30 tablet, Rfl: 3   Multiple Vitamin (MULTI-VITAMIN DAILY PO), Take by mouth., Disp: , Rfl:    prasugrel  (EFFIENT ) 10 MG TABS tablet, Take 1 tablet (10 mg total) by mouth daily., Disp: 90 tablet, Rfl: 3   rosuvastatin  (CRESTOR ) 20 MG tablet, Take 1 tablet (20 mg total) by mouth at bedtime., Disp: 90 tablet, Rfl: 3   vitamin B-12 (CYANOCOBALAMIN) 250 MCG tablet, Take 250 mcg by mouth daily., Disp: , Rfl:   Past Medical History: Past Medical History:  Diagnosis Date   Arthritis    CAD (coronary artery disease)    a. 05/2023 NSTEMI-->Myoview : high risk w/ septal/apical infarct/ischemia; b. 05/2023 Cath/PCI: LM nl, LAD 166m/d (2.5x22 Xience Frontier DES x 2), D2 50ost, LCX min irregs, RCA 20/20p-->12 mos DAPT followed by  SAPT rec.   Diastolic dysfunction    a. 05/2023 Echo: EF 55-60%, no rwma, GrI DD, nl RV fxn, RVSP 22.1 mmHg.   Headache    hx cluster headaches   Hyperlipidemia LDL goal <70    Hypertension    Pneumonia 2015   bacterial    Tobacco Use: Social History   Tobacco Use  Smoking Status Every Day   Current packs/day: 0.50   Average packs/day: 0.5 packs/day for 40.0 years (20.0 ttl pk-yrs)   Types: Cigarettes  Smokeless Tobacco Never    Labs: Review Flowsheet       Latest Ref Rng & Units 05/23/2023 07/02/2023  Labs for ITP Cardiac and Pulmonary Rehab  Cholestrol 100 - 199 mg/dL 409  811   LDL (calc) 0 - 99 mg/dL 85  58   HDL-C >91 mg/dL 41  47   Trlycerides 0 - 149 mg/dL 478  95      Exercise Target Goals: Exercise Program Goal: Individual exercise prescription set using results from initial 6 min walk test and THRR while considering  patient's activity barriers and safety.   Exercise Prescription Goal: Initial exercise prescription builds to 30-45 minutes a day of aerobic activity, 2-3 days per week.  Home exercise guidelines will be given to patient during program as part of exercise prescription that the participant will acknowledge.   Education: Aerobic Exercise: - Group verbal and visual presentation on the components of exercise prescription. Introduces  F.I.T.T principle from ACSM for exercise prescriptions.  Reviews F.I.T.T. principles of aerobic exercise including progression. Written material given at graduation.   Education: Resistance Exercise: - Group verbal and visual presentation on the components of exercise prescription. Introduces F.I.T.T principle from ACSM for exercise prescriptions  Reviews F.I.T.T. principles of resistance exercise including progression. Written material given at graduation.    Education: Exercise & Equipment Safety: - Individual verbal instruction and demonstration of equipment use and safety with use of the equipment. Flowsheet Row  Cardiac Rehab from 06/06/2023 in [facility] Cardiac and Pulmonary Rehab  Date 06/06/23  Educator [facility]  Instruction Review Code 1- Verbalizes Understanding       Education: Exercise Physiology & General Exercise Guidelines: - Group verbal and written instruction with models to review the exercise physiology of the cardiovascular system and associated critical values. Provides general exercise guidelines with specific guidelines to those with heart or lung disease.    Education: Flexibility, Balance, Mind/Body Relaxation: - Group verbal and visual presentation with interactive activity on the components of exercise prescription. Introduces F.I.T.T principle from ACSM for exercise prescriptions. Reviews F.I.T.T. principles of flexibility and balance exercise training including progression. Also discusses the mind body connection.  Reviews various relaxation techniques to help reduce and manage stress (i.e. Deep breathing, progressive muscle relaxation, and visualization). Balance handout provided to take home. Written material given at graduation.   Activity Barriers & Risk Stratification:  Activity Barriers & Cardiac Risk Stratification - 06/06/23 1205       Activity Barriers & Cardiac Risk Stratification   Activity Barriers Left Knee Replacement;Right Knee Replacement;Other (comment)    Comments Left shoulder replacement. No issues with shoulder replacement. No pain with knee replacements but does have some limitied ROM    Cardiac Risk Stratification Moderate             6 Minute Walk:  6 Minute Walk     Row Name 06/06/23 1202         6 Minute Walk   Phase Initial     Distance 1160 feet     Walk Time 6 minutes     # of Rest Breaks 0     MPH 2.2     METS 2.76     RPE 7     Perceived Dyspnea  0     VO2 Peak 9.66     Symptoms No     Resting HR 73 bpm     Resting BP 130/80     Resting Oxygen Saturation  96 %     Exercise Oxygen Saturation  during 6 min walk 96 %     Max Ex. HR  89 bpm     Max Ex. BP 150/80     2 Minute Post BP 124/80              Oxygen Initial Assessment:   Oxygen Re-Evaluation:   Oxygen Discharge (Final Oxygen Re-Evaluation):   Initial Exercise Prescription:  Initial Exercise Prescription - 06/06/23 1200       Date of Initial Exercise RX and Referring Provider   Date 06/06/23    Referring Provider Dr. Randene Bustard      Oxygen   Maintain Oxygen Saturation 88% or higher      Treadmill   MPH 2    Grade 1    Minutes 15    METs 2.81      REL-XR   Level 2    Speed 50    Minutes 15  METs 2.76      T5 Nustep   Level 2    SPM 80    Minutes 15    METs 2.76      Prescription Details   Frequency (times per week) 3    Duration Progress to 30 minutes of continuous aerobic without signs/symptoms of physical distress      Intensity   THRR 40-80% of Max Heartrate 105-138    Ratings of Perceived Exertion 11-13    Perceived Dyspnea 0-4      Progression   Progression Continue to progress workloads to maintain intensity without signs/symptoms of physical distress.      Resistance Training   Training Prescription Yes    Weight 5    Reps 10-15             Perform Capillary Blood Glucose checks as needed.  Exercise Prescription Changes:   Exercise Prescription Changes     Row Name 06/06/23 1200 06/28/23 1700 07/12/23 1300 07/25/23 1700 07/26/23 1800     Response to Exercise   Blood Pressure (Admit) 130/80 122/76 118/70 -- 122/70   Blood Pressure (Exercise) 150/80 162/86 164/68 -- --   Blood Pressure (Exit) 124/80 118/68 118/64 -- 124/62   Heart Rate (Admit) 73 bpm 81 bpm 89 bpm -- 81 bpm   Heart Rate (Exercise) 89 bpm 118 bpm 112 bpm -- 108 bpm   Heart Rate (Exit) 75 bpm 97 bpm 84 bpm -- 73 bpm   Oxygen Saturation (Admit) 96 % -- -- -- --   Oxygen Saturation (Exercise) 96 % -- -- -- --   Oxygen Saturation (Exit) 97 % -- -- -- --   Rating of Perceived Exertion (Exercise) 7 13 15  -- 13   Perceived Dyspnea  (Exercise) 0 -- -- -- --   Symptoms none none none -- none   Comments 6 MWT results First 2 weeks of exercise sessions -- -- --   Duration -- Continue with 30 min of aerobic exercise without signs/symptoms of physical distress. Continue with 30 min of aerobic exercise without signs/symptoms of physical distress. Continue with 30 min of aerobic exercise without signs/symptoms of physical distress. Continue with 30 min of aerobic exercise without signs/symptoms of physical distress.   Intensity -- THRR unchanged THRR unchanged THRR unchanged THRR unchanged     Progression   Progression -- Continue to progress workloads to maintain intensity without signs/symptoms of physical distress. Continue to progress workloads to maintain intensity without signs/symptoms of physical distress. Continue to progress workloads to maintain intensity without signs/symptoms of physical distress. Continue to progress workloads to maintain intensity without signs/symptoms of physical distress.   Average METs -- 2.7 3.74 3.74 2.96     Resistance Training   Training Prescription -- Yes Yes Yes Yes   Weight -- 5 5 5 5    Reps -- 10-15 10-15 10-15 10-15     Interval Training   Interval Training -- No No No No     Treadmill   MPH -- 2 -- -- --   Grade -- 1 -- -- --   Minutes -- 15 -- -- --   METs -- 2.81 -- -- --     NuStep   Level -- -- 4 4 5    Minutes -- -- 15 15 15    METs -- -- 5.8 5.8 5     Arm Ergometer   Level -- -- 1.5 1.5 2   Minutes -- -- 15 15 15    METs -- --  1 1 1      REL-XR   Level -- 2 4 4 7    Minutes -- 14 15 15 15    METs -- 3.5 4.9 4.9 5.6     T5 Nustep   Level -- 3 3 3 3    Minutes -- 15 15 15 15    METs -- 2.3 3.6 3.6 3.1     Biostep-RELP   Level -- 4 -- -- --   SPM -- 50 -- -- --   Minutes -- 15 -- -- --   METs -- 3 -- -- --     Home Exercise Plan   Plans to continue exercise at -- -- -- [employer] (comment)  plans to join [employer] (comment)  plans to join  [employer] -- -- -- Add 2 additional days to program exercise sessions. Add 2 additional days to program exercise sessions.   Initial Home Exercises Provided -- -- -- 07/25/23 07/25/23     Oxygen   Maintain Oxygen Saturation -- 88% or higher 88% or higher 88% or higher 88% or higher            Exercise Comments:   Exercise Comments     Row Name 06/11/23 1733           Exercise Comments First full day of exercise!  Patient was oriented to gym and equipment including functions, settings, policies, and procedures.  Patient's individual exercise prescription and treatment plan were reviewed.  All starting workloads were established based on the results of the 6 minute walk test done at initial orientation visit.  The plan for exercise progression was also introduced and progression will be customized based on patient's performance and goals.                Exercise Goals and Review:   Exercise Goals     Row Name 06/06/23 1210             Exercise Goals   Increase Physical Activity Yes       Intervention Provide advice, education, support and counseling about physical activity/exercise needs.;Develop an individualized exercise prescription for aerobic and resistive training based on initial evaluation findings, risk stratification, comorbidities and participant's personal goals.       Expected Outcomes Short Term: Attend rehab on a regular basis to increase amount of physical activity.;Long Term: Add in home exercise to make exercise part of routine and to increase amount of physical activity.;Long Term: Exercising regularly at least 3-5 days a week.       Increase Strength and Stamina Yes       Intervention Provide advice, education, support and counseling about physical activity/exercise needs.;Develop an individualized exercise prescription for aerobic and resistive training based on initial evaluation findings, risk stratification, comorbidities and participant's  personal goals.       Expected Outcomes Short Term: Increase workloads from initial exercise prescription for resistance, speed, and METs.;Short Term: Perform resistance training exercises routinely during rehab and add in resistance training at home;Long Term: Improve cardiorespiratory fitness, muscular endurance and strength as measured by increased METs and functional capacity ( )       Able to understand and use rate of perceived exertion (RPE) scale Yes       Intervention Provide education and explanation on how to use RPE scale       Expected Outcomes Short Term: Able to use RPE daily in rehab to express subjective intensity level;Long Term:  Able to use RPE  to guide intensity level when exercising independently       Able to understand and use Dyspnea scale Yes       Intervention Provide education and explanation on how to use Dyspnea scale       Expected Outcomes Short Term: Able to use Dyspnea scale daily in rehab to express subjective sense of shortness of breath during exertion;Long Term: Able to use Dyspnea scale to guide intensity level when exercising independently       Knowledge and understanding of Target Heart Rate Range (THRR) Yes       Intervention Provide education and explanation of THRR including how the numbers were predicted and where they are located for reference       Expected Outcomes Short Term: Able to state/look up THRR;Long Term: Able to use THRR to govern intensity when exercising independently;Short Term: Able to use daily as guideline for intensity in rehab       Able to check pulse independently Yes       Intervention Provide education and demonstration on how to check pulse in carotid and radial arteries.;Review the importance of being able to check your own pulse for safety during independent exercise       Expected Outcomes Short Term: Able to explain why pulse checking is important during independent exercise;Long Term: Able to check pulse independently and  accurately       Understanding of Exercise Prescription Yes       Intervention Provide education, explanation, and written materials on patient's individual exercise prescription       Expected Outcomes Short Term: Able to explain program exercise prescription;Long Term: Able to explain home exercise prescription to exercise independently                Exercise Goals Re-Evaluation :  Exercise Goals Re-Evaluation     Row Name 06/11/23 1733 06/28/23 1751 07/12/23 1356 07/25/23 1744 07/26/23 1810     Exercise Goal Re-Evaluation   Exercise Goals Review Increase Physical Activity;Able to understand and use rate of perceived exertion (RPE) scale;Knowledge and understanding of Target Heart Rate Range (THRR);Understanding of Exercise Prescription;Increase Strength and Stamina;Able to check pulse independently Increase Physical Activity;Understanding of Exercise Prescription;Increase Strength and Stamina Increase Physical Activity;Understanding of Exercise Prescription;Increase Strength and Stamina Increase Physical Activity;Increase Strength and Stamina;Able to understand and use rate of perceived exertion (RPE) scale;Able to understand and use Dyspnea scale;Knowledge and understanding of Target Heart Rate Range (THRR);Able to check pulse independently;Understanding of Exercise Prescription Increase Physical Activity;Understanding of Exercise Prescription;Increase Strength and Stamina   Comments Reviewed RPE and dyspnea scale, THR and program prescription with pt today.  Pt voiced understanding and was given a copy of goals to take home. Gerald Garza is off to a good start in the program and finished his first 2 weeks in this review. He did the treadmill at a speed of 2. with 1% incline. He did level 2 on the XR and level 3 on the T5 nustep. He also added the biostep to his prescription at level 4. We will continue to monitor his progress in the program. Gerald Garza is doing well in rehab. He recently able to increase  from level 2 to 4 on the XR. He was also able to maintain level 4 on the T4 nustep. We will continue to monitor his progress in the program. Reviewed home exercise with pt today.  Pt plans to join the [facility]  for exercise.  Reviewed THR, pulse, RPE, sign and symptoms, pulse oximetery  and when to call 911 or MD.  Also discussed weather considerations and indoor options.  Pt voiced understanding. Gerald Garza continues to do well in rehab. He increased to level 5 on the T4 nustep, level 7 on the XR, and level 2 on the arm ergometer. We will continue to monitor his progress in the program.   Expected Outcomes Short: Use RPE daily to regulate intensity.  Long: Follow program prescription in THR. Short: Continue to follow current exercise prescription. Long: Continue exercise to improve strength and stamina. Short: Continue to follow current exercise prescription. Long: Continue exercise to improve strength and stamina. Short: add 1-2 days a week of exercise at home on off days of rehab. Join [employer]. Long: maintain independent exercise routine. Short: Continue to progressively increase nustep, XR, and arm ergometer workloads. Long: Continue exercise to improve strength and stamina.            Discharge Exercise Prescription (Final Exercise Prescription Changes):  Exercise Prescription Changes - 07/26/23 1800       Response to Exercise   Blood Pressure (Admit) 122/70    Blood Pressure (Exit) 124/62    Heart Rate (Admit) 81 bpm    Heart Rate (Exercise) 108 bpm    Heart Rate (Exit) 73 bpm    Rating of Perceived Exertion (Exercise) 13    Symptoms none    Duration Continue with 30 min of aerobic exercise without signs/symptoms of physical distress.    Intensity THRR unchanged      Progression   Progression Continue to progress workloads to maintain intensity without signs/symptoms of physical distress.    Average METs 2.96      Resistance Training   Training Prescription Yes    Weight 5    Reps 10-15       Interval Training   Interval Training No      NuStep   Level 5    Minutes 15    METs 5      Arm Ergometer   Level 2    Minutes 15    METs 1      REL-XR   Level 7    Minutes 15    METs 5.6      T5 Nustep   Level 3    Minutes 15    METs 3.1      Home Exercise Plan   Plans to continue exercise at [employer] (comment)   plans to join [employer]   Frequency Add 2 additional days to program exercise sessions.    Initial Home Exercises Provided 07/25/23      Oxygen   Maintain Oxygen Saturation 88% or higher             Nutrition:  Target Goals: Understanding of nutrition guidelines, daily intake of sodium 1500mg , cholesterol 200mg , calories 30% from fat and 7% or less from saturated fats, daily to have 5 or more servings of fruits and vegetables.  Education: All About Nutrition: -Group instruction provided by verbal, written material, interactive activities, discussions, models, and posters to present general guidelines for heart healthy nutrition including fat, fiber, MyPlate, the role of sodium in heart healthy nutrition, utilization of the nutrition label, and utilization of this knowledge for meal planning. Follow up email sent as well. Written material given at graduation.   Biometrics:  Pre Biometrics - 06/06/23 1211       Pre Biometrics   Height 5' 11.5" (1.816 m)    Weight 228 lb 1.6 oz (103.5 kg)  Waist Circumference 44 inches    Hip Circumference 44.5 inches    Waist to Hip Ratio 0.99 %    BMI (Calculated) 31.37    Single Leg Stand 15.68 seconds              Nutrition Therapy Plan and Nutrition Goals:  Nutrition Therapy & Goals - 06/18/23 1732       Nutrition Therapy   Diet Cardiac, Low Na    Protein (specify units) 80-100    Fiber 30 grams    Whole Grain Foods 3 servings    Saturated Fats 15 max. grams    Fruits and Vegetables 5 servings/day    Sodium 2 grams      Personal Nutrition Goals   Nutrition Goal Reduce saturated fat,  less than 12g per day. Replace bad fats for more heart healthy fats.    Personal Goal #2 Read labels and reduce sodium intake to below 2300mg . Ideally 1500mg  per day.    Personal Goal #3 Eat 15-30gProtein and 30-60gCarbs at each meal.    Comments Patient drinking lots of water when at work. But when he is home, he drinks lots of milk. Says he has been going through 2 gallons per week. Drinks some lite beers as well. Spoke with him about reducing fat content of milk, he says he used to drink whole milk but has cut down to 2%. Commended him on this change and encouraged him to continue to reduce fat percentage or reduce how much he is drinking. He will try to drink 1 gallon per week. Educated on identifying nutrients of concern like saturated fat and sodium. Provided guideline limits to sodium and saturated fat, 1500mg  and 15gSat fat respectively. Talked with him about his diet, sodium will need to be monitored and encouraged him to read labels to prevent getting surprised my foods that have way too much sodium. He also likes a lot of saturated rich foods. He may need to work on decreasing his saturated fat intake more. Encouraged and supported him on this journey to make better changes. Reviewed mediterranean diet handout. Educated on types of fats, sources, and how to read labels. Together, looked at several facts labels of foods he likes. Walked though label evaluations with aim to better his understanding and confidence in reading labels independently. Brainstormed several meals and snacks with foods he likes and will eat.      Intervention Plan   Intervention Prescribe, educate and counsel regarding individualized specific dietary modifications aiming towards targeted core components such as weight, hypertension, lipid management, diabetes, heart failure and other comorbidities.;Nutrition handout(s) given to patient.    Expected Outcomes Short Term Goal: Understand basic principles of dietary content, such  as calories, fat, sodium, cholesterol and nutrients.;Short Term Goal: A plan has been developed with personal nutrition goals set during dietitian appointment.;Long Term Goal: Adherence to prescribed nutrition plan.             Nutrition Assessments:  MEDIFICTS Score Key: >=70 Need to make dietary changes  40-70 Heart Healthy Diet <= 40 Therapeutic Level Cholesterol Diet  Flowsheet Row Cardiac Rehab from 06/11/2023 in [facility] Cardiac and Pulmonary Rehab  Picture Your Plate Total Score on Admission 50      Picture Your Plate Scores: <16 Unhealthy dietary pattern with much room for improvement. 41-50 Dietary pattern unlikely to meet recommendations for good health and room for improvement. 51-60 More healthful dietary pattern, with some room for improvement.  >60 Healthy dietary pattern, although there  may be some specific behaviors that could be improved.    Nutrition Goals Re-Evaluation:  Nutrition Goals Re-Evaluation     Row Name 07/25/23 1752             Goals   Nutrition Goal Reduce saturated fat, less than 12g per day. Replace bad fats for more heart healthy fats.       Comment Patient reports that he is reading food labels and working on reducing sodium and saturated fats in his diet. He does include lean protein in diet and is trying to make nutritional changes that have helped in his weight loss goal.       Expected Outcome Short: continue to work towards weight loss goal of 210 lbs. Long: maintain heart healthy diet.         Personal Goal #2 Re-Evaluation   Personal Goal #2 Read labels and reduce sodium intake to below 2300mg . Ideally 1500mg  per day.         Personal Goal #3 Re-Evaluation   Personal Goal #3 Eat 15-30gProtein and 30-60gCarbs at each meal.                Nutrition Goals Discharge (Final Nutrition Goals Re-Evaluation):  Nutrition Goals Re-Evaluation - 07/25/23 1752       Goals   Nutrition Goal Reduce saturated fat, less than 12g per day.  Replace bad fats for more heart healthy fats.    Comment Patient reports that he is reading food labels and working on reducing sodium and saturated fats in his diet. He does include lean protein in diet and is trying to make nutritional changes that have helped in his weight loss goal.    Expected Outcome Short: continue to work towards weight loss goal of 210 lbs. Long: maintain heart healthy diet.      Personal Goal #2 Re-Evaluation   Personal Goal #2 Read labels and reduce sodium intake to below 2300mg . Ideally 1500mg  per day.      Personal Goal #3 Re-Evaluation   Personal Goal #3 Eat 15-30gProtein and 30-60gCarbs at each meal.             Psychosocial: Target Goals: Acknowledge presence or absence of significant depression and/or stress, maximize coping skills, provide positive support system. Participant is able to verbalize types and ability to use techniques and skills needed for reducing stress and depression.   Education: Stress, Anxiety, and Depression - Group verbal and visual presentation to define topics covered.  Reviews how body is impacted by stress, anxiety, and depression.  Also discusses healthy ways to reduce stress and to treat/manage anxiety and depression.  Written material given at graduation.   Education: Sleep Hygiene -Provides group verbal and written instruction about how sleep can affect your health.  Define sleep hygiene, discuss sleep cycles and impact of sleep habits. Review good sleep hygiene tips.    Initial Review & Psychosocial Screening:  Initial Psych Review & Screening - 05/30/23 1346       Initial Review   Current issues with None Identified      Family Dynamics   Good Support System? Yes    Comments Gerald Garza has his wife and 4 dogs to look to for support. He does not take anything for his mood and states no mental instability.      Barriers   Psychosocial barriers to participate in program There are no identifiable barriers or psychosocial  needs.;The patient should benefit from training in stress management and relaxation.  Screening Interventions   Interventions Encouraged to exercise;To provide support and resources with identified psychosocial needs;Provide feedback about the scores to participant    Expected Outcomes Short Term goal: Utilizing psychosocial counselor, staff and physician to assist with identification of specific Stressors or current issues interfering with healing process. Setting desired goal for each stressor or current issue identified.;Long Term Goal: Stressors or current issues are controlled or eliminated.;Short Term goal: Identification and review with participant of any Quality of Life or Depression concerns found by scoring the questionnaire.;Long Term goal: The participant improves quality of Life and PHQ9 Scores as seen by post scores and/or verbalization of changes             Quality of Life Scores:   Quality of Life - 06/11/23 1747       Quality of Life   Select Quality of Life      Quality of Life Scores   Health/Function Pre 26.57 %    Socioeconomic Pre 29.64 %    Psych/Spiritual Pre 30 %    Family Pre 30 %    GLOBAL Pre 28.37 %            Scores of 19 and below usually indicate a poorer quality of life in these areas.  A difference of  2-3 points is a clinically meaningful difference.  A difference of 2-3 points in the total score of the Quality of Life Index has been associated with significant improvement in overall quality of life, self-image, physical symptoms, and general health in studies assessing change in quality of life.  PHQ-9: Review Flowsheet       06/06/2023  Depression screen PHQ 2/9  Decreased Interest 0  Down, Depressed, Hopeless 0  PHQ - 2 Score 0  Altered sleeping 0  Tired, decreased energy 1  Change in appetite 0  Feeling bad or failure about yourself  0  Trouble concentrating 0  Moving slowly or fidgety/restless 0  Suicidal thoughts 0  PHQ-9  Score 1  Difficult doing work/chores Not difficult at all   Interpretation of Total Score  Total Score Depression Severity:  1-4 = Minimal depression, 5-9 = Mild depression, 10-14 = Moderate depression, 15-19 = Moderately severe depression, 20-27 = Severe depression   Psychosocial Evaluation and Intervention:  Psychosocial Evaluation - 05/30/23 1348       Psychosocial Evaluation & Interventions   Interventions Encouraged to exercise with the program and follow exercise prescription;Relaxation education;Stress management education    Comments Gerald Garza has his wife and 4 dogs to look to for support. He does not take anything for his mood and states no mental instability.    Expected Outcomes Short: Start [facility] to help with mood. Long: Maintain a healthy mental state    Continue Psychosocial Services  Follow up required by staff             Psychosocial Re-Evaluation:  Psychosocial Re-Evaluation     Row Name 07/25/23 1754             Psychosocial Re-Evaluation   Current issues with None Identified       Comments Patient reports that he has no new concerns with sleep, stress, or mental health. He continues to have a good support system.       Expected Outcomes Short: continue to consistently exercise in cardiac rehab for mental health benefits. Long: maintain good mental health and sleep routine.       Interventions Encouraged to attend Cardiac Rehabilitation for the exercise  Continue Psychosocial Services  Follow up required by staff                Psychosocial Discharge (Final Psychosocial Re-Evaluation):  Psychosocial Re-Evaluation - 07/25/23 1754       Psychosocial Re-Evaluation   Current issues with None Identified    Comments Patient reports that he has no new concerns with sleep, stress, or mental health. He continues to have a good support system.    Expected Outcomes Short: continue to consistently exercise in cardiac rehab for mental health benefits.  Long: maintain good mental health and sleep routine.    Interventions Encouraged to attend Cardiac Rehabilitation for the exercise    Continue Psychosocial Services  Follow up required by staff             Vocational Rehabilitation: Provide vocational rehab assistance to qualifying candidates.   Vocational Rehab Evaluation & Intervention:   Education: Education Goals: Education classes will be provided on a variety of topics geared toward better understanding of heart health and risk factor modification. Participant will state understanding/return demonstration of topics presented as noted by education test scores.  Learning Barriers/Preferences:  Learning Barriers/Preferences - 05/30/23 1344       Learning Barriers/Preferences   Learning Barriers None    Learning Preferences None             General Cardiac Education Topics:  AED/CPR: - Group verbal and written instruction with the use of models to demonstrate the basic use of the AED with the basic ABC's of resuscitation.   Anatomy and Cardiac Procedures: - Group verbal and visual presentation and models provide information about basic cardiac anatomy and function. Reviews the testing methods done to diagnose heart disease and the outcomes of the test results. Describes the treatment choices: Medical Management, Angioplasty, or Coronary Bypass Surgery for treating various heart conditions including Myocardial Infarction, Angina, Valve Disease, and Cardiac Arrhythmias.  Written material given at graduation.   Medication Safety: - Group verbal and visual instruction to review commonly prescribed medications for heart and lung disease. Reviews the medication, class of the drug, and side effects. Includes the steps to properly store meds and maintain the prescription regimen.  Written material given at graduation.   Intimacy: - Group verbal instruction through game format to discuss how heart and lung disease can affect  sexual intimacy. Written material given at graduation..   Know Your Numbers and Heart Failure: - Group verbal and visual instruction to discuss disease risk factors for cardiac and pulmonary disease and treatment options.  Reviews associated critical values for Overweight/Obesity, Hypertension, Cholesterol, and Diabetes.  Discusses basics of heart failure: signs/symptoms and treatments.  Introduces Heart Failure Zone chart for action plan for heart failure.  Written material given at graduation.   Infection Prevention: - Provides verbal and written material to individual with discussion of infection control including proper hand washing and proper equipment cleaning during exercise session. Flowsheet Row Cardiac Rehab from 06/06/2023 in [facility] Cardiac and Pulmonary Rehab  Date 06/06/23  Educator [facility]  Instruction Review Code 1- Verbalizes Understanding       Falls Prevention: - Provides verbal and written material to individual with discussion of falls prevention and safety. Flowsheet Row [facility]  Date 06/06/23  Educator [facility]  Instruction Review Code 1- Verbalizes Understanding       Other: -Provides group and verbal instruction on various topics (see comments)   Knowledge Questionnaire Score:  Knowledge Questionnaire  Score - 06/11/23 1747       Knowledge Questionnaire Score   Pre Score 21/26             Core Components/Risk Factors/Patient Goals at Admission:  Personal Goals and Risk Factors at Admission - 06/06/23 1212       Core Components/Risk Factors/Patient Goals on Admission   Intervention Weight Management: Develop a combined nutrition and exercise program designed to reach desired caloric intake, while maintaining appropriate intake of nutrient and fiber, sodium and fats, and appropriate energy expenditure required for the weight goal.;Weight Management: Provide education and appropriate resources to help  participant work on and attain dietary goals.;Weight Management/Obesity: Establish reasonable short term and long term weight goals.;Obesity: Provide education and appropriate resources to help participant work on and attain dietary goals.    Expected Outcomes Short Term: Continue to assess and modify interventions until short term weight is achieved;Weight Loss: Understanding of general recommendations for a balanced deficit meal plan, which promotes 1-2 lb weight loss per week and includes a negative energy balance of [phone] kcal/d;Understanding recommendations for meals to include 15-35% energy as protein, 25-35% energy from fat, 35-60% energy from carbohydrates, less than 200mg  of dietary cholesterol, 20-35 gm of total fiber daily;Understanding of distribution of calorie intake throughout the day with the consumption of 4-5 meals/snacks;Long Term: Adherence to nutrition and physical activity/exercise program aimed toward attainment of established weight goal    Tobacco Cessation Yes    Number of packs per day .2    Intervention Assist the participant in steps to quit. Provide individualized education and counseling about committing to Tobacco Cessation, relapse prevention, and pharmacological support that can be provided by physician.;Education officer, environmental, assist with locating and accessing local/national Quit Smoking programs, and support quit date choice.    Expected Outcomes Short Term: Will demonstrate readiness to quit, by selecting a quit date.;Short Term: Will quit all tobacco product use, adhering to prevention of relapse plan.;Long Term: Complete abstinence from all tobacco products for at least 12 months from quit date.    Hypertension Yes    Intervention Provide education on lifestyle modifcations including regular physical activity/exercise, weight management, moderate sodium restriction and increased consumption of fresh fruit, vegetables, and low fat dairy, alcohol moderation, and  smoking cessation.;Monitor prescription use compliance.    Expected Outcomes Short Term: Continued assessment and intervention until BP is < 140/32mm HG in hypertensive participants. < 130/66mm HG in hypertensive participants with diabetes, heart failure or chronic kidney disease.;Long Term: Maintenance of blood pressure at goal levels.    Lipids Yes    Intervention Provide education and support for participant on nutrition & aerobic/resistive exercise along with prescribed medications to achieve LDL 70mg , HDL >40mg .    Expected Outcomes Short Term: Participant states understanding of desired cholesterol values and is compliant with medications prescribed. Participant is following exercise prescription and nutrition guidelines.;Long Term: Cholesterol controlled with medications as prescribed, with individualized exercise RX and with personalized nutrition plan. Value goals: LDL < 70mg , HDL > 40 mg.             Education:Diabetes - Individual verbal and written instruction to review signs/symptoms of diabetes, desired ranges of glucose level fasting, after meals and with exercise. Acknowledge that pre and post exercise glucose checks will be done for 3 sessions at entry of program.   Core Components/Risk Factors/Patient Goals Review:   Goals and Risk Factor Review     Row Name 06/06/23 1213 07/25/23 1745  Core Components/Risk Factors/Patient Goals Review   Personal Goals Review Tobacco Cessation Tobacco Cessation;Weight Management/Obesity;Lipids;Hypertension      Review Gerald Garza is a current tobacco user. Intervention for tobacco cessation was provided at the initial medical review. He} was asked about readiness to quit and reported he has cut backto3-4 cigarettes a day and does not have a quit date yet but is planning to continue to cut back and eventually quit . Patient was advised and educated about tobacco cessation using combination therapy, tobacco cessation classes,  quit line, and quit smoking apps. Patient demonstrated understanding of this material. Staff will continue to provide encouragement and follow up with the patient throughout the program Gerald Garza reports that he has gotten down to 2 cigarettes today, and is currently using the patch, which has been very helpful in reducing the number of cigarettes. He is thinking about picking a quit date but does not have one yet. He has been successfully working towards his weight loss goal. He stated he is down to 222 lbs from 233 lbs. He continues to work towards a weight goal of 205-210 lbs. He takes all his BP and cholesterol meds and follows up regularly with doctor for all required lab work. He does have a BP cuff at home and checks it over the weekend when he is not coming to rehab. He was encouraged to start getting into the habit of checking it consistently at home.      Expected Outcomes Short: continue to cut back and pick a quit  date. Long: become tobacco free Short: pick a quit date for smoking cessation. 215 lbs short term weight goal. Start chekcing BP at home more consistently. Long: quit smoking, continue to control cardiac risk factors. Weight goal of 210 lbs.               Core Components/Risk Factors/Patient Goals at Discharge (Final Review):   Goals and Risk Factor Review - 07/25/23 1745       Core Components/Risk Factors/Patient Goals Review   Personal Goals Review Tobacco Cessation;Weight Management/Obesity;Lipids;Hypertension    Review Gerald Garza reports that he has gotten down to 2 cigarettes today, and is currently using the patch, which has been very helpful in reducing the number of cigarettes. He is thinking about picking a quit date but does not have one yet. He has been successfully working towards his weight loss goal. He stated he is down to 222 lbs from 233 lbs. He continues to work towards a weight goal of 205-210 lbs. He takes all his BP and cholesterol meds and follows up regularly with  doctor for all required lab work. He does have a BP cuff at home and checks it over the weekend when he is not coming to rehab. He was encouraged to start getting into the habit of checking it consistently at home.    Expected Outcomes Short: pick a quit date for smoking cessation. 215 lbs short term weight goal. Start chekcing BP at home more consistently. Long: quit smoking, continue to control cardiac risk factors. Weight goal of 210 lbs.             ITP Comments:  ITP Comments     Row Name 05/30/23 1343 06/06/23 1200 06/11/23 1732 06/13/23 1137 07/11/23 1316   ITP Comments Virtual Visit completed. Patient informed on EP and RD appointment and 6 Minute walk test. Patient also informed of patient health questionnaires on My Chart. Patient Verbalizes understanding. Visit diagnosis can be found in [facility]  05/23/2023. Completed and gym orientation. Initial ITP created and sent for review to Dr. Firman Hughes, Medical Director. Gerald Garza is a current tobacco user. Intervention for tobacco cessation was provided at the initial medical review. He} was asked about readiness to quit and reported he has cut backto3-4 cigarettes a day and does not have a quit date yet but is planning to continue to cut back and eventually quit . Patient was advised and educated about tobacco cessation using combination therapy, tobacco cessation classes, quit line, and quit smoking apps. Patient demonstrated understanding of this material. Staff will continue to provide encouragement and follow up with the patient throughout the program. First full day of exercise!  Patient was oriented to gym and equipment including functions, settings, policies, and procedures.  Patient's individual exercise prescription and treatment plan were reviewed.  All starting workloads were established based on the results of the 6 minute walk test done at initial orientation visit.  The plan for exercise progression was also introduced and  progression will be customized based on patient's performance and goals. 30 Day review completed. Medical Director ITP review done, changes made as directed, and signed approval by Medical Director.   new to prgram 30 Day review completed. Medical Director ITP review done, changes made as directed, and signed approval by Medical Director.    Row Name 08/08/23 1110           ITP Comments 30 Day review completed. Medical Director ITP review done, changes made as directed, and signed approval by Medical Director.                Comments:

## 2023-08-08 NOTE — Progress Notes (Signed)
 Daily Session Note  Patient Details  Name: Gerald Garza MRN: [id number] Date of Birth: 11/06/1960 Referring Provider:   Flowsheet Row Cardiac Rehab from 06/06/2023 in [facility] Cardiac and Pulmonary Rehab  Referring Provider Dr. Randene Bustard       Encounter Date: 08/08/2023  Check In:  Session Check In - 08/08/23 1757       Check-In   Supervising physician immediately available to respond to emergencies See telemetry face sheet for immediately available ER MD    Location [facility]-Cardiac & Pulmonary Rehab    Staff Present Maud Sorenson, RN, BSN, CCRP;Latravis Hood RCP,RRT,BSRT;Kelly [place] BS, ACSM CEP, Exercise Physiologist    Virtual Visit No    Medication changes reported     No    Fall or balance concerns reported    No    Warm-up and Cool-down Performed on first and last piece of equipment    Resistance Training Performed Yes    VAD Patient? No    PAD/SET Patient? No      Pain Assessment   Currently in Pain? No/denies                Social History   Tobacco Use  Smoking Status Every Day   Current packs/day: 0.50   Average packs/day: 0.5 packs/day for 40.0 years (20.0 ttl pk-yrs)   Types: Cigarettes  Smokeless Tobacco Never    Goals Met:  Independence with exercise equipment Exercise tolerated well No report of concerns or symptoms today  Goals Unmet:  Not Applicable  Comments: Pt able to follow exercise prescription today without complaint.  Will continue to monitor for progression.    Dr. Firman Hughes is Medical Director for [facility] Cardiac Rehabilitation.  Dr. Fuad Aleskerov is Medical Director for [facility] Pulmonary Rehabilitation.

## 2023-08-09 ENCOUNTER — Encounter

## 2023-08-13 ENCOUNTER — Encounter

## 2023-08-13 ENCOUNTER — Encounter: Admitting: *Deleted

## 2023-08-13 DIAGNOSIS — I252 Old myocardial infarction: Secondary | ICD-10-CM | POA: Diagnosis not present

## 2023-08-13 DIAGNOSIS — Z48812 Encounter for surgical aftercare following surgery on the circulatory system: Secondary | ICD-10-CM | POA: Diagnosis not present

## 2023-08-13 DIAGNOSIS — Z955 Presence of coronary angioplasty implant and graft: Secondary | ICD-10-CM | POA: Diagnosis not present

## 2023-08-13 DIAGNOSIS — I214 Non-ST elevation (NSTEMI) myocardial infarction: Secondary | ICD-10-CM

## 2023-08-13 DIAGNOSIS — F17210 Nicotine dependence, cigarettes, uncomplicated: Secondary | ICD-10-CM | POA: Diagnosis not present

## 2023-08-13 NOTE — Progress Notes (Signed)
 Daily Session Note  Patient Details  Name: Gerald Garza MRN: [id number] Date of Birth: 1960/08/29 Referring Provider:   Flowsheet Row Cardiac Rehab from 06/06/2023 in [facility] Cardiac and Pulmonary Rehab  Referring Provider Dr. Randene Bustard       Encounter Date: 08/13/2023  Check In:  Session Check In - 08/13/23 0925       Check-In   Supervising physician immediately available to respond to emergencies See telemetry face sheet for immediately available ER MD    Location [facility]-Cardiac & Pulmonary Rehab    Staff Present Maud Sorenson, RN, BSN, CCRP;Margaret Best, MS, Exercise Physiologist;Maxon Conetta BS, Exercise Physiologist;Kelly Bollinger [facility]    Virtual Visit No    Medication changes reported     No    Fall or balance concerns reported    No    Warm-up and Cool-down Performed on first and last piece of equipment    Resistance Training Performed Yes    VAD Patient? No    PAD/SET Patient? No      Pain Assessment   Currently in Pain? No/denies                Social History   Tobacco Use  Smoking Status Every Day   Current packs/day: 0.50   Average packs/day: 0.5 packs/day for 40.0 years (20.0 ttl pk-yrs)   Types: Cigarettes  Smokeless Tobacco Never    Goals Met:  Independence with exercise equipment Exercise tolerated well No report of concerns or symptoms today  Goals Unmet:  Not Applicable  Comments: Pt able to follow exercise prescription today without complaint.  Will continue to monitor for progression.    Dr. Firman Hughes is Medical Director for [facility] Cardiac Rehabilitation.  Dr. Fuad Aleskerov is Medical Director for [facility] Pulmonary Rehabilitation.

## 2023-08-15 ENCOUNTER — Encounter: Admitting: *Deleted

## 2023-08-15 DIAGNOSIS — I252 Old myocardial infarction: Secondary | ICD-10-CM | POA: Diagnosis not present

## 2023-08-15 DIAGNOSIS — F17210 Nicotine dependence, cigarettes, uncomplicated: Secondary | ICD-10-CM | POA: Diagnosis not present

## 2023-08-15 DIAGNOSIS — Z955 Presence of coronary angioplasty implant and graft: Secondary | ICD-10-CM

## 2023-08-15 DIAGNOSIS — Z48812 Encounter for surgical aftercare following surgery on the circulatory system: Secondary | ICD-10-CM | POA: Diagnosis not present

## 2023-08-15 DIAGNOSIS — I214 Non-ST elevation (NSTEMI) myocardial infarction: Secondary | ICD-10-CM

## 2023-08-15 NOTE — Progress Notes (Signed)
 Daily Session Note  Patient Details  Name: Gerald Garza MRN: [id number] Date of Birth: [birth year] Referring Provider:   Flowsheet Row Cardiac Rehab from 06/06/2023 in [facility] Cardiac and Pulmonary Rehab  Referring Provider Dr. Randene Bustard       Encounter Date: 08/15/2023  Check In:  Session Check In - 08/15/23 0929       Check-In   Supervising physician immediately available to respond to emergencies See telemetry face sheet for immediately available ER MD    Location [facility]-Cardiac & Pulmonary Rehab    Staff Present Maud Sorenson, RN, BSN, CCRP;Meredith Manson [place] RN,BSN;Noah Tickle, BS, Exercise Physiologist;Kelly Bollinger RN,BSN,MPA;Jason Martina Sledge [facility]    Virtual Visit No    Medication changes reported     No    Fall or balance concerns reported    No    Warm-up and Cool-down Performed on first and last piece of equipment    Resistance Training Performed Yes    VAD Patient? No    PAD/SET Patient? No      Pain Assessment   Currently in Pain? No/denies                Social History   Tobacco Use  Smoking Status Every Day   Current packs/day: 0.50   Average packs/day: 0.5 packs/day for 40.0 years (20.0 ttl pk-yrs)   Types: Cigarettes  Smokeless Tobacco Never    Goals Met:  Independence with exercise equipment Exercise tolerated well No report of concerns or symptoms today  Goals Unmet:  Not Applicable  Comments: Pt able to follow exercise prescription today without complaint.  Will continue to monitor for progression.    Dr. Firman Hughes is Medical Director for [facility] Cardiac Rehabilitation.  Dr. Fuad Aleskerov is Medical Director for [facility] Pulmonary Rehabilitation.

## 2023-08-16 ENCOUNTER — Encounter

## 2023-08-20 ENCOUNTER — Encounter: Admitting: *Deleted

## 2023-08-20 DIAGNOSIS — I252 Old myocardial infarction: Secondary | ICD-10-CM | POA: Diagnosis not present

## 2023-08-20 DIAGNOSIS — Z955 Presence of coronary angioplasty implant and graft: Secondary | ICD-10-CM

## 2023-08-20 DIAGNOSIS — F17210 Nicotine dependence, cigarettes, uncomplicated: Secondary | ICD-10-CM | POA: Diagnosis not present

## 2023-08-20 DIAGNOSIS — I214 Non-ST elevation (NSTEMI) myocardial infarction: Secondary | ICD-10-CM

## 2023-08-20 DIAGNOSIS — Z48812 Encounter for surgical aftercare following surgery on the circulatory system: Secondary | ICD-10-CM | POA: Diagnosis not present

## 2023-08-20 NOTE — Progress Notes (Signed)
 Daily Session Note  Patient Details  Name: Gerald Garza MRN: [id number] Date of Birth: 1960/09/12 Referring Provider:   Flowsheet Row Cardiac Rehab from 06/06/2023 in [facility] Cardiac and Pulmonary Rehab  Referring Provider Dr. Randene Bustard    Encounter Date: 08/20/2023  Check In:  Session Check In - 08/20/23 1720       Check-In   Supervising physician immediately available to respond to emergencies See telemetry face sheet for immediately available ER MD    Location [facility]-Cardiac & Pulmonary Rehab    Staff Present Sue Em RN,BSN;Yamen Tyler [facility] BS, ACSM CEP, Exercise Physiologist    Virtual Visit No    Medication changes reported     No    Fall or balance concerns reported    No    Warm-up and Cool-down Performed on first and last piece of equipment    Resistance Training Performed Yes    VAD Patient? No    PAD/SET Patient? No      Pain Assessment   Currently in Pain? No/denies             Social History   Tobacco Use  Smoking Status Every Day   Current packs/day: 0.50   Average packs/day: 0.5 packs/day for 40.0 years (20.0 ttl pk-yrs)   Types: Cigarettes  Smokeless Tobacco Never    Goals Met:  Independence with exercise equipment Exercise tolerated well No report of concerns or symptoms today Strength training completed today  Goals Unmet:  Not Applicable  Comments: Pt able to follow exercise prescription today without complaint.  Will continue to monitor for progression.    Dr. Firman Hughes is Medical Director for [facility] Cardiac Rehabilitation.  Dr. Fuad Aleskerov is Medical Director for [facility] Pulmonary Rehabilitation.

## 2023-08-22 ENCOUNTER — Encounter: Admitting: *Deleted

## 2023-08-22 DIAGNOSIS — Z955 Presence of coronary angioplasty implant and graft: Secondary | ICD-10-CM

## 2023-08-22 DIAGNOSIS — I214 Non-ST elevation (NSTEMI) myocardial infarction: Secondary | ICD-10-CM

## 2023-08-22 DIAGNOSIS — Z48812 Encounter for surgical aftercare following surgery on the circulatory system: Secondary | ICD-10-CM | POA: Diagnosis not present

## 2023-08-22 DIAGNOSIS — I252 Old myocardial infarction: Secondary | ICD-10-CM | POA: Diagnosis not present

## 2023-08-22 DIAGNOSIS — F17210 Nicotine dependence, cigarettes, uncomplicated: Secondary | ICD-10-CM | POA: Diagnosis not present

## 2023-08-22 NOTE — Progress Notes (Signed)
 Daily Session Note  Patient Details  Name: Gerald Garza MRN: [id number] Date of Birth: Mar 08, 1960 Referring Provider:   Flowsheet Row Cardiac Rehab from 06/06/2023 in [facility] Cardiac and Pulmonary Rehab  Referring Provider Dr. Randene Bustard    Encounter Date: 08/22/2023  Check In:  Session Check In - 08/22/23 1741       Check-In   Supervising physician immediately available to respond to emergencies See telemetry face sheet for immediately available ER MD    Location [facility]-Cardiac & Pulmonary Rehab    Staff Present Sue Em RN,BSN;[facility] Cramp BS, ACSM CEP, Exercise Physiologist    Virtual Visit No    Medication changes reported     No    Fall or balance concerns reported    No    Warm-up and Cool-down Performed on first and last piece of equipment    Resistance Training Performed Yes    VAD Patient? No    PAD/SET Patient? No      Pain Assessment   Currently in Pain? No/denies             Social History   Tobacco Use  Smoking Status Every Day   Current packs/day: 0.50   Average packs/day: 0.5 packs/day for 40.0 years (20.0 ttl pk-yrs)   Types: Cigarettes  Smokeless Tobacco Never    Goals Met:  Independence with exercise equipment Exercise tolerated well No report of concerns or symptoms today Strength training completed today  Goals Unmet:  Not Applicable  Comments: Pt able to follow exercise prescription today without complaint.  Will continue to monitor for progression.    Dr. Firman Hughes is Medical Director for [facility] Cardiac Rehabilitation.  Dr. Fuad Aleskerov is Medical Director for [facility] Pulmonary Rehabilitation.

## 2023-08-23 ENCOUNTER — Encounter

## 2023-08-27 ENCOUNTER — Encounter

## 2023-08-29 ENCOUNTER — Encounter

## 2023-08-30 ENCOUNTER — Encounter

## 2023-09-03 ENCOUNTER — Encounter: Admitting: *Deleted

## 2023-09-03 DIAGNOSIS — Z955 Presence of coronary angioplasty implant and graft: Secondary | ICD-10-CM | POA: Diagnosis not present

## 2023-09-03 DIAGNOSIS — I214 Non-ST elevation (NSTEMI) myocardial infarction: Secondary | ICD-10-CM

## 2023-09-03 DIAGNOSIS — F17210 Nicotine dependence, cigarettes, uncomplicated: Secondary | ICD-10-CM | POA: Diagnosis not present

## 2023-09-03 DIAGNOSIS — I252 Old myocardial infarction: Secondary | ICD-10-CM | POA: Diagnosis not present

## 2023-09-03 DIAGNOSIS — Z48812 Encounter for surgical aftercare following surgery on the circulatory system: Secondary | ICD-10-CM | POA: Diagnosis not present

## 2023-09-03 NOTE — Progress Notes (Signed)
 Daily Session Note  Patient Details  Name: Gerald Garza MRN: [id number] Date of Birth: Jan 06, 1961 Referring Provider:   Flowsheet Row Cardiac Rehab from 06/06/2023 in [facility] Cardiac and Pulmonary Rehab  Referring Provider Dr. Alm Clay    Encounter Date: 09/03/2023  Check In:  Session Check In - 09/03/23 1727       Check-In   Supervising physician immediately available to respond to emergencies See telemetry face sheet for immediately available ER MD    Location [facility]-Cardiac & Pulmonary Rehab    Staff Present [name] RN,BSN;[facility] [place] Best, MS, Exercise Physiologist    Virtual Visit No    Medication changes reported     No    Fall or balance concerns reported    No    Warm-up and Cool-down Performed on first and last piece of equipment    Resistance Training Performed Yes    VAD Patient? No    PAD/SET Patient? No      Pain Assessment   Currently in Pain? No/denies             Social History   Tobacco Use  Smoking Status Every Day   Current packs/day: 0.50   Average packs/day: 0.5 packs/day for 40.0 years (20.0 ttl pk-yrs)   Types: Cigarettes  Smokeless Tobacco Never    Goals Met:  Independence with exercise equipment Exercise tolerated well No report of concerns or symptoms today Strength training completed today  Goals Unmet:  Not Applicable  Comments: Pt able to follow exercise prescription today without complaint.  Will continue to monitor for progression.    Dr. Oneil Pinal is Medical Director for [facility] Cardiac Rehabilitation.  Dr. Fuad Aleskerov is Medical Director for [facility] Pulmonary Rehabilitation.

## 2023-09-05 ENCOUNTER — Encounter: Payer: Self-pay | Admitting: *Deleted

## 2023-09-05 ENCOUNTER — Encounter

## 2023-09-05 DIAGNOSIS — I214 Non-ST elevation (NSTEMI) myocardial infarction: Secondary | ICD-10-CM

## 2023-09-05 DIAGNOSIS — Z955 Presence of coronary angioplasty implant and graft: Secondary | ICD-10-CM

## 2023-09-05 NOTE — Progress Notes (Signed)
 Cardiac Individual Treatment Plan  Patient Details  Name: Gerald Garza MRN: [id number] Date of Birth: 06/13/1960 Referring Provider:   Flowsheet Row Cardiac Rehab from 06/06/2023 in [facility] Cardiac and Pulmonary Rehab  Referring Provider Dr. Alm Clay    Initial Encounter Date:  Flowsheet Row Cardiac Rehab from 06/06/2023 in [facility] Cardiac and Pulmonary Rehab  Date 06/06/23    Visit Diagnosis: NSTEMI (non-ST elevated myocardial infarction) [facility])  Status post coronary artery stent placement  Patient's Home Medications on Admission:  Current Outpatient Medications:    amLODipine  (NORVASC ) 5 MG tablet, Take 1 tablet (5 mg total) by mouth daily., Disp: 90 tablet, Rfl: 3   aspirin  EC 81 MG tablet, Take 1 tablet (81 mg total) by mouth daily. Swallow whole., Disp: 30 tablet, Rfl: 12   fish oil-omega-3 fatty acids 1000 MG capsule, Take 2 g by mouth daily., Disp: , Rfl:    lisinopril -hydrochlorothiazide  (PRINZIDE ,ZESTORETIC ) 20-25 MG tablet, Take 1 tablet by mouth daily., Disp: , Rfl:    Magnesium 250 MG TABS, Take by mouth 2 (two) times daily., Disp: , Rfl:    metoprolol  succinate (TOPROL -XL) 25 MG 24 hr tablet, Take 0.5 tablets (12.5 mg total) by mouth daily., Disp: 30 tablet, Rfl: 3   Multiple Vitamin (MULTI-VITAMIN DAILY PO), Take by mouth., Disp: , Rfl:    prasugrel  (EFFIENT ) 10 MG TABS tablet, Take 1 tablet (10 mg total) by mouth daily., Disp: 90 tablet, Rfl: 3   rosuvastatin  (CRESTOR ) 20 MG tablet, Take 1 tablet (20 mg total) by mouth at bedtime., Disp: 90 tablet, Rfl: 3   vitamin B-12 (CYANOCOBALAMIN) 250 MCG tablet, Take 250 mcg by mouth daily., Disp: , Rfl:   Past Medical History: Past Medical History:  Diagnosis Date   Arthritis    CAD (coronary artery disease)    a. 05/2023 NSTEMI-->Myoview : high risk w/ septal/apical infarct/ischemia; b. 05/2023 Cath/PCI: LM nl, LAD 172m/d (2.5x22 Xience Frontier DES x 2), D2 50ost, LCX min irregs, RCA 20/20p-->12 mos DAPT followed by SAPT rec.    Diastolic dysfunction    a. 05/2023 Echo: EF 55-60%, no rwma, GrI DD, nl RV fxn, RVSP 22.1 mmHg.   Headache    hx cluster headaches   Hyperlipidemia LDL goal <70    Hypertension    Pneumonia 2015   bacterial    Tobacco Use: Social History   Tobacco Use  Smoking Status Every Day   Current packs/day: 0.50   Average packs/day: 0.5 packs/day for 40.0 years (20.0 ttl pk-yrs)   Types: Cigarettes  Smokeless Tobacco Never    Labs: Review Flowsheet       Latest Ref Rng & Units 05/23/2023 07/02/2023  Labs for ITP Cardiac and Pulmonary Rehab  Cholestrol 100 - 199 mg/dL 799  876   LDL (calc) 0 - 99 mg/dL 85  58   HDL-C >60 mg/dL 41  47   Trlycerides 0 - 149 mg/dL 631  95      Exercise Target Goals: Exercise Program Goal: Individual exercise prescription set using results from initial 6 min walk test and THRR while considering  patient's activity barriers and safety.   Exercise Prescription Goal: Initial exercise prescription builds to 30-45 minutes a day of aerobic activity, 2-3 days per week.  Home exercise guidelines will be given to patient during program as part of exercise prescription that the participant will acknowledge.   Education: Aerobic Exercise: - Group verbal and visual presentation on the components of exercise prescription. Introduces F.I.T.T principle from ACSM for exercise  prescriptions.  Reviews F.I.T.T. principles of aerobic exercise including progression. Written material given at graduation.   Education: Resistance Exercise: - Group verbal and visual presentation on the components of exercise prescription. Introduces F.I.T.T principle from ACSM for exercise prescriptions  Reviews F.I.T.T. principles of resistance exercise including progression. Written material given at graduation.    Education: Exercise & Equipment Safety: - Individual verbal instruction and demonstration of equipment use and safety with use of the equipment. Flowsheet Row Cardiac Rehab  from 06/06/2023 in [facility] Cardiac and Pulmonary Rehab  Date 06/06/23  Educator [facility]  Instruction Review Code 1- Verbalizes Understanding    Education: Exercise Physiology & General Exercise Guidelines: - Group verbal and written instruction with models to review the exercise physiology of the cardiovascular system and associated critical values. Provides general exercise guidelines with specific guidelines to those with heart or lung disease.    Education: Flexibility, Balance, Mind/Body Relaxation: - Group verbal and visual presentation with interactive activity on the components of exercise prescription. Introduces F.I.T.T principle from ACSM for exercise prescriptions. Reviews F.I.T.T. principles of flexibility and balance exercise training including progression. Also discusses the mind body connection.  Reviews various relaxation techniques to help reduce and manage stress (i.e. Deep breathing, progressive muscle relaxation, and visualization). Balance handout provided to take home. Written material given at graduation.   Activity Barriers & Risk Stratification:  Activity Barriers & Cardiac Risk Stratification - 06/06/23 1205       Activity Barriers & Cardiac Risk Stratification   Activity Barriers Left Knee Replacement;Right Knee Replacement;Other (comment)    Comments Left shoulder replacement. No issues with shoulder replacement. No pain with knee replacements but does have some limitied ROM    Cardiac Risk Stratification Moderate          6 Minute Walk:  6 Minute Walk     Row Name 06/06/23 1202         6 Minute Walk   Phase Initial     Distance 1160 feet     Walk Time 6 minutes     # of Rest Breaks 0     MPH 2.2     METS 2.76     RPE 7     Perceived Dyspnea  0     VO2 Peak 9.66     Symptoms No     Resting HR 73 bpm     Resting BP 130/80     Resting Oxygen Saturation  96 %     Exercise Oxygen Saturation  during 6 min walk 96 %     Max Ex. HR 89 bpm     Max Ex. BP  150/80     2 Minute Post BP 124/80        Oxygen Initial Assessment:   Oxygen Re-Evaluation:   Oxygen Discharge (Final Oxygen Re-Evaluation):   Initial Exercise Prescription:  Initial Exercise Prescription - 06/06/23 1200       Date of Initial Exercise RX and Referring Provider   Date 06/06/23    Referring Provider Dr. Alm Clay      Oxygen   Maintain Oxygen Saturation 88% or higher      Treadmill   MPH 2    Grade 1    Minutes 15    METs 2.81      REL-XR   Level 2    Speed 50    Minutes 15    METs 2.76      T5 Nustep   Level 2  SPM 80    Minutes 15    METs 2.76      Prescription Details   Frequency (times per week) 3    Duration Progress to 30 minutes of continuous aerobic without signs/symptoms of physical distress      Intensity   THRR 40-80% of Max Heartrate 105-138    Ratings of Perceived Exertion 11-13    Perceived Dyspnea 0-4      Progression   Progression Continue to progress workloads to maintain intensity without signs/symptoms of physical distress.      Resistance Training   Training Prescription Yes    Weight 5    Reps 10-15          Perform Capillary Blood Glucose checks as needed.  Exercise Prescription Changes:   Exercise Prescription Changes     Row Name 06/06/23 1200 06/28/23 1700 07/12/23 1300 07/25/23 1700 07/26/23 1800     Response to Exercise   Blood Pressure (Admit) 130/80 122/76 118/70 -- 122/70   Blood Pressure (Exercise) 150/80 162/86 164/68 -- --   Blood Pressure (Exit) 124/80 118/68 118/64 -- 124/62   Heart Rate (Admit) 73 bpm 81 bpm 89 bpm -- 81 bpm   Heart Rate (Exercise) 89 bpm 118 bpm 112 bpm -- 108 bpm   Heart Rate (Exit) 75 bpm 97 bpm 84 bpm -- 73 bpm   Oxygen Saturation (Admit) 96 % -- -- -- --   Oxygen Saturation (Exercise) 96 % -- -- -- --   Oxygen Saturation (Exit) 97 % -- -- -- --   Rating of Perceived Exertion (Exercise) 7 13 15  -- 13   Perceived Dyspnea (Exercise) 0 -- -- -- --   Symptoms  none none none -- none   Comments 6 MWT results First 2 weeks of exercise sessions -- -- --   Duration -- Continue with 30 min of aerobic exercise without signs/symptoms of physical distress. Continue with 30 min of aerobic exercise without signs/symptoms of physical distress. Continue with 30 min of aerobic exercise without signs/symptoms of physical distress. Continue with 30 min of aerobic exercise without signs/symptoms of physical distress.   Intensity -- THRR unchanged THRR unchanged THRR unchanged THRR unchanged     Progression   Progression -- Continue to progress workloads to maintain intensity without signs/symptoms of physical distress. Continue to progress workloads to maintain intensity without signs/symptoms of physical distress. Continue to progress workloads to maintain intensity without signs/symptoms of physical distress. Continue to progress workloads to maintain intensity without signs/symptoms of physical distress.   Average METs -- 2.7 3.74 3.74 2.96     Resistance Training   Training Prescription -- Yes Yes Yes Yes   Weight -- 5 5 5 5    Reps -- 10-15 10-15 10-15 10-15     Interval Training   Interval Training -- No No No No     Treadmill   MPH -- 2 -- -- --   Grade -- 1 -- -- --   Minutes -- 15 -- -- --   METs -- 2.81 -- -- --     NuStep   Level -- -- 4 4 5    Minutes -- -- 15 15 15    METs -- -- 5.8 5.8 5     Arm Ergometer   Level -- -- 1.5 1.5 2   Minutes -- -- 15 15 15    METs -- -- 1 1 1      REL-XR   Level -- 2 4 4 7    Minutes --  14 15 15 15    METs -- 3.5 4.9 4.9 5.6     T5 Nustep   Level -- 3 3 3 3    Minutes -- 15 15 15 15    METs -- 2.3 3.6 3.6 3.1     Biostep-RELP   Level -- 4 -- -- --   SPM -- 50 -- -- --   Minutes -- 15 -- -- --   METs -- 3 -- -- --     Home Exercise Plan   Plans to continue exercise at -- -- -- [employer] (comment)  plans to join [employer] (comment)  plans to join [employer] -- -- -- Add 2  additional days to program exercise sessions. Add 2 additional days to program exercise sessions.   Initial Home Exercises Provided -- -- -- 07/25/23 07/25/23     Oxygen   Maintain Oxygen Saturation -- 88% or higher 88% or higher 88% or higher 88% or higher    Row Name 08/08/23 1500 08/23/23 0900 09/05/23 0700         Response to Exercise   Blood Pressure (Admit) 124/60 142/82 130/82     Blood Pressure (Exit) 126/72 124/72 108/64     Heart Rate (Admit) 84 bpm 76 bpm 79 bpm     Heart Rate (Exercise) 112 bpm 106 bpm 98 bpm     Heart Rate (Exit) 112 bpm 85 bpm 84 bpm     Oxygen Saturation (Admit) 94 % -- --     Oxygen Saturation (Exercise) 95 % -- --     Oxygen Saturation (Exit) 97 % -- --     Rating of Perceived Exertion (Exercise) 15 13 12      Symptoms none none none     Duration Continue with 30 min of aerobic exercise without signs/symptoms of physical distress. Continue with 30 min of aerobic exercise without signs/symptoms of physical distress. Continue with 30 min of aerobic exercise without signs/symptoms of physical distress.     Intensity THRR unchanged THRR unchanged THRR unchanged       Progression   Progression Continue to progress workloads to maintain intensity without signs/symptoms of physical distress. Continue to progress workloads to maintain intensity without signs/symptoms of physical distress. Continue to progress workloads to maintain intensity without signs/symptoms of physical distress.     Average METs 4.65 2.15 3.35       Resistance Training   Training Prescription Yes Yes Yes     Weight 5 7 lb 7 lb     Reps 10-15 10-15 10-15       Interval Training   Interval Training No No No       NuStep   Level 5 -- 5     Minutes 15 -- 15     METs 5.7 -- 5.3       Arm Ergometer   Level 2 2 1      Minutes 15 15 15      METs -- 1 3       Elliptical   Level -- 1 --     Speed -- 0 --     Minutes -- 15 --       REL-XR   Level -- 3 --     Minutes -- 15 --      METs -- 3.6 --       T5 Nustep   Level 4 4 4      Minutes 15 15 15  METs -- 4.4 3       Home Exercise Plan   Plans to continue exercise at [employer] (comment)  plans to join [employer] (comment)  plans to join [employer] (comment)  plans to join [employer] Add 2 additional days to program exercise sessions. Add 2 additional days to program exercise sessions. Add 2 additional days to program exercise sessions.     Initial Home Exercises Provided 07/25/23 07/25/23 07/25/23       Oxygen   Maintain Oxygen Saturation 88% or higher 88% or higher 88% or higher        Exercise Comments:   Exercise Comments     Row Name 06/11/23 1733           Exercise Comments First full day of exercise!  Patient was oriented to gym and equipment including functions, settings, policies, and procedures.  Patient's individual exercise prescription and treatment plan were reviewed.  All starting workloads were established based on the results of the 6 minute walk test done at initial orientation visit.  The plan for exercise progression was also introduced and progression will be customized based on patient's performance and goals.          Exercise Goals and Review:   Exercise Goals     Row Name 06/06/23 1210             Exercise Goals   Increase Physical Activity Yes       Intervention Provide advice, education, support and counseling about physical activity/exercise needs.;Develop an individualized exercise prescription for aerobic and resistive training based on initial evaluation findings, risk stratification, comorbidities and participant's personal goals.       Expected Outcomes Short Term: Attend rehab on a regular basis to increase amount of physical activity.;Long Term: Add in home exercise to make exercise part of routine and to increase amount of physical activity.;Long Term: Exercising regularly at least 3-5 days a week.       Increase  Strength and Stamina Yes       Intervention Provide advice, education, support and counseling about physical activity/exercise needs.;Develop an individualized exercise prescription for aerobic and resistive training based on initial evaluation findings, risk stratification, comorbidities and participant's personal goals.       Expected Outcomes Short Term: Increase workloads from initial exercise prescription for resistance, speed, and METs.;Short Term: Perform resistance training exercises routinely during rehab and add in resistance training at home;Long Term: Improve cardiorespiratory fitness, muscular endurance and strength as measured by increased METs and functional capacity ( )       Able to understand and use rate of perceived exertion (RPE) scale Yes       Intervention Provide education and explanation on how to use RPE scale       Expected Outcomes Short Term: Able to use RPE daily in rehab to express subjective intensity level;Long Term:  Able to use RPE to guide intensity level when exercising independently       Able to understand and use Dyspnea scale Yes       Intervention Provide education and explanation on how to use Dyspnea scale       Expected Outcomes Short Term: Able to use Dyspnea scale daily in rehab to express subjective sense of shortness of breath during exertion;Long Term: Able to use Dyspnea scale to guide intensity level when exercising independently       Knowledge and understanding of Target Heart Rate Range (THRR)  Yes       Intervention Provide education and explanation of THRR including how the numbers were predicted and where they are located for reference       Expected Outcomes Short Term: Able to state/look up THRR;Long Term: Able to use THRR to govern intensity when exercising independently;Short Term: Able to use daily as guideline for intensity in rehab       Able to check pulse independently Yes       Intervention Provide education and demonstration on how  to check pulse in carotid and radial arteries.;Review the importance of being able to check your own pulse for safety during independent exercise       Expected Outcomes Short Term: Able to explain why pulse checking is important during independent exercise;Long Term: Able to check pulse independently and accurately       Understanding of Exercise Prescription Yes       Intervention Provide education, explanation, and written materials on patient's individual exercise prescription       Expected Outcomes Short Term: Able to explain program exercise prescription;Long Term: Able to explain home exercise prescription to exercise independently          Exercise Goals Re-Evaluation :  Exercise Goals Re-Evaluation     Row Name 06/11/23 1733 06/28/23 1751 07/12/23 1356 07/25/23 1744 07/26/23 1810     Exercise Goal Re-Evaluation   Exercise Goals Review Increase Physical Activity;Able to understand and use rate of perceived exertion (RPE) scale;Knowledge and understanding of Target Heart Rate Range (THRR);Understanding of Exercise Prescription;Increase Strength and Stamina;Able to check pulse independently Increase Physical Activity;Understanding of Exercise Prescription;Increase Strength and Stamina Increase Physical Activity;Understanding of Exercise Prescription;Increase Strength and Stamina Increase Physical Activity;Increase Strength and Stamina;Able to understand and use rate of perceived exertion (RPE) scale;Able to understand and use Dyspnea scale;Knowledge and understanding of Target Heart Rate Range (THRR);Able to check pulse independently;Understanding of Exercise Prescription Increase Physical Activity;Understanding of Exercise Prescription;Increase Strength and Stamina   Comments Reviewed RPE and dyspnea scale, THR and program prescription with pt today.  Pt voiced understanding and was given a copy of goals to take home. Gerald Garza is off to a good start in the program and finished his first 2 weeks in  this review. He did the treadmill at a speed of 2. with 1% incline. He did level 2 on the XR and level 3 on the T5 nustep. He also added the biostep to his prescription at level 4. We will continue to monitor his progress in the program. Gerald Garza is doing well in rehab. He recently able to increase from level 2 to 4 on the XR. He was also able to maintain level 4 on the T4 nustep. We will continue to monitor his progress in the program. Reviewed home exercise with pt today.  Pt plans to join the [facility]  for exercise.  Reviewed THR, pulse, RPE, sign and symptoms, pulse oximetery and when to call 911 or MD.  Also discussed weather considerations and indoor options.  Pt voiced understanding. Gerald Garza continues to do well in rehab. He increased to level 5 on the T4 nustep, level 7 on the XR, and level 2 on the arm ergometer. We will continue to monitor his progress in the program.   Expected Outcomes Short: Use RPE daily to regulate intensity.  Long: Follow program prescription in THR. Short: Continue to follow current exercise prescription. Long: Continue exercise to improve strength and stamina. Short: Continue to follow current exercise prescription. Long: Continue exercise  to improve strength and stamina. Short: add 1-2 days a week of exercise at home on off days of rehab. Join [employer]. Long: maintain independent exercise routine. Short: Continue to progressively increase nustep, XR, and arm ergometer workloads. Long: Continue exercise to improve strength and stamina.    Row Name 08/08/23 1536 08/13/23 1023 08/23/23 0907 09/05/23 0800       Exercise Goal Re-Evaluation   Exercise Goals Review Increase Physical Activity;Understanding of Exercise Prescription;Increase Strength and Stamina Increase Physical Activity;Understanding of Exercise Prescription Increase Physical Activity;Understanding of Exercise Prescription;Increase Strength and Stamina Increase Physical Activity;Understanding of Exercise Prescription;Increase  Strength and Stamina    Comments Gerald Garza is doing well in rehab. He was recently able to increase from level 3 to 4 on the T5 nustep. He has been able to maintain level 4 on the T5 nustep and level 2 on the Arm ergometer. We will continue to monitor his progress in the program. Gerald Garza is planning on joining the [facility] when he is closer to graduating. He says he lives 2 minutes away and thinks he can manage a consistent exercise routine with his work schedule. He wants to start walking more on his off days from the program Gerald Garza is doing well in rehab. He continues to work at level 2 on the arm crank and level 4 on the T5 nustep. He also increased to 7 lb hand weights for resistance training. We will continue to monitor his progress in the progran. Gerald Garza continues to do well in rehab. He continues to work at level 5 on the T4 nustep and level 4 on the T5 nustep. He is also due for his post and will look to improve on it. We will continue to monitor his progress in the progran.    Expected Outcomes Short: Continue to increase T4 nustep workloads. Long: Continue exercise to improve strength and stamina. Short: walk on his off days from the program and look [facility] [employer]. Long: maintain an exercise routine. Short: Continue to progressively increase workloads. Long: Continue exercise to improve strength and stamina. Short: Improve on post . Long: Continue exercise to improve strength and stamina.       Discharge Exercise Prescription (Final Exercise Prescription Changes):  Exercise Prescription Changes - 09/05/23 0700       Response to Exercise   Blood Pressure (Admit) 130/82    Blood Pressure (Exit) 108/64    Heart Rate (Admit) 79 bpm    Heart Rate (Exercise) 98 bpm    Heart Rate (Exit) 84 bpm    Rating of Perceived Exertion (Exercise) 12    Symptoms none    Duration Continue with 30 min of aerobic exercise without signs/symptoms of physical distress.    Intensity THRR unchanged       Progression   Progression Continue to progress workloads to maintain intensity without signs/symptoms of physical distress.    Average METs 3.35      Resistance Training   Training Prescription Yes    Weight 7 lb    Reps 10-15      Interval Training   Interval Training No      NuStep   Level 5    Minutes 15    METs 5.3      Arm Ergometer   Level 1    Minutes 15    METs 3      T5 Nustep   Level 4    Minutes 15    METs 3  Home Exercise Plan   Plans to continue exercise at [facility] (comment)   plans to join [employer]   Frequency Add 2 additional days to program exercise sessions.    Initial Home Exercises Provided 07/25/23      Oxygen   Maintain Oxygen Saturation 88% or higher          Nutrition:  Target Goals: Understanding of nutrition guidelines, daily intake of sodium 1500mg , cholesterol 200mg , calories 30% from fat and 7% or less from saturated fats, daily to have 5 or more servings of fruits and vegetables.  Education: All About Nutrition: -Group instruction provided by verbal, written material, interactive activities, discussions, models, and posters to present general guidelines for heart healthy nutrition including fat, fiber, MyPlate, the role of sodium in heart healthy nutrition, utilization of the nutrition label, and utilization of this knowledge for meal planning. Follow up email sent as well. Written material given at graduation.   Biometrics:  Pre Biometrics - 06/06/23 1211       Pre Biometrics   Height 5' 11.5 (1.816 m)    Weight 228 lb 1.6 oz (103.5 kg)    Waist Circumference 44 inches    Hip Circumference 44.5 inches    Waist to Hip Ratio 0.99 %    BMI (Calculated) 31.37    Single Leg Stand 15.68 seconds           Nutrition Therapy Plan and Nutrition Goals:  Nutrition Therapy & Goals - 06/18/23 1732       Nutrition Therapy   Diet Cardiac, Low Na    Protein (specify units) 80-100    Fiber 30 grams    Whole Grain Foods  3 servings    Saturated Fats 15 max. grams    Fruits and Vegetables 5 servings/day    Sodium 2 grams      Personal Nutrition Goals   Nutrition Goal Reduce saturated fat, less than 12g per day. Replace bad fats for more heart healthy fats.    Personal Goal #2 Read labels and reduce sodium intake to below 2300mg . Ideally 1500mg  per day.    Personal Goal #3 Eat 15-30gProtein and 30-60gCarbs at each meal.    Comments Patient drinking lots of water when at work. But when he is home, he drinks lots of milk. Says he has been going through 2 gallons per week. Drinks some lite beers as well. Spoke with him about reducing fat content of milk, he says he used to drink whole milk but has cut down to 2%. Commended him on this change and encouraged him to continue to reduce fat percentage or reduce how much he is drinking. He will try to drink 1 gallon per week. Educated on identifying nutrients of concern like saturated fat and sodium. Provided guideline limits to sodium and saturated fat, 1500mg  and 15gSat fat respectively. Talked with him about his diet, sodium will need to be monitored and encouraged him to read labels to prevent getting surprised my foods that have way too much sodium. He also likes a lot of saturated rich foods. He may need to work on decreasing his saturated fat intake more. Encouraged and supported him on this journey to make better changes. Reviewed mediterranean diet handout. Educated on types of fats, sources, and how to read labels. Together, looked at several facts labels of foods he likes. Walked though label evaluations with aim to better his understanding and confidence in reading labels independently. Brainstormed several meals and snacks with foods he likes  and will eat.      Intervention Plan   Intervention Prescribe, educate and counsel regarding individualized specific dietary modifications aiming towards targeted core components such as weight, hypertension, lipid management,  diabetes, heart failure and other comorbidities.;Nutrition handout(s) given to patient.    Expected Outcomes Short Term Goal: Understand basic principles of dietary content, such as calories, fat, sodium, cholesterol and nutrients.;Short Term Goal: A plan has been developed with personal nutrition goals set during dietitian appointment.;Long Term Goal: Adherence to prescribed nutrition plan.          Nutrition Assessments:  MEDIFICTS Score Key: >=70 Need to make dietary changes  40-70 Heart Healthy Diet <= 40 Therapeutic Level Cholesterol Diet  Flowsheet Row Cardiac Rehab from 06/11/2023 in [facility]  Picture Your Plate Total Score on Admission 50   Picture Your Plate Scores: <59 Unhealthy dietary pattern with much room for improvement. 41-50 Dietary pattern unlikely to meet recommendations for good health and room for improvement. 51-60 More healthful dietary pattern, with some room for improvement.  >60 Healthy dietary pattern, although there may be some specific behaviors that could be improved.    Nutrition Goals Re-Evaluation:  Nutrition Goals Re-Evaluation     Row Name 07/25/23 1752 08/13/23 1021           Goals   Nutrition Goal Reduce saturated fat, less than 12g per day. Replace bad fats for more heart healthy fats. Reduce saturated fat, less than 12g per day. Replace bad fats for more heart healthy fats.      Comment Patient reports that he is reading food labels and working on reducing sodium and saturated fats in his diet. He does include lean protein in diet and is trying to make nutritional changes that have helped in his weight loss goal. Gerald Garza reports his wife and he have been watching food labels and cooking healthier foods.They are choosing foods with lower saturated fats and improving their protein intake.      Expected Outcome Short: continue to work towards weight loss goal of 210 lbs. Long: maintain heart healthy diet. Short: continue to  choose heart healthy foods. Long: independently manage heart healthy diet        Personal Goal #2 Re-Evaluation   Personal Goal #2 Read labels and reduce sodium intake to below 2300mg . Ideally 1500mg  per day. --        Personal Goal #3 Re-Evaluation   Personal Goal #3 Eat 15-30gProtein and 30-60gCarbs at each meal. --         Nutrition Goals Discharge (Final Nutrition Goals Re-Evaluation):  Nutrition Goals Re-Evaluation - 08/13/23 1021       Goals   Nutrition Goal Reduce saturated fat, less than 12g per day. Replace bad fats for more heart healthy fats.    Comment Gerald Garza reports his wife and he have been watching food labels and cooking healthier foods.They are choosing foods with lower saturated fats and improving their protein intake.    Expected Outcome Short: continue to choose heart healthy foods. Long: independently manage heart healthy diet          Psychosocial: Target Goals: Acknowledge presence or absence of significant depression and/or stress, maximize coping skills, provide positive support system. Participant is able to verbalize types and ability to use techniques and skills needed for reducing stress and depression.   Education: Stress, Anxiety, and Depression - Group verbal and visual presentation to define topics covered.  Reviews how body is impacted by stress, anxiety,  and depression.  Also discusses healthy ways to reduce stress and to treat/manage anxiety and depression.  Written material given at graduation.   Education: Sleep Hygiene -Provides group verbal and written instruction about how sleep can affect your health.  Define sleep hygiene, discuss sleep cycles and impact of sleep habits. Review good sleep hygiene tips.    Initial Review & Psychosocial Screening:  Initial Psych Review & Screening - 05/30/23 1346       Initial Review   Current issues with None Identified      Family Dynamics   Good Support System? Yes    Comments Gerald Garza has his wife and 4  dogs to look to for support. He does not take anything for his mood and states no mental instability.      Barriers   Psychosocial barriers to participate in program There are no identifiable barriers or psychosocial needs.;The patient should benefit from training in stress management and relaxation.      Screening Interventions   Interventions Encouraged to exercise;To provide support and resources with identified psychosocial needs;Provide feedback about the scores to participant    Expected Outcomes Short Term goal: Utilizing psychosocial counselor, staff and physician to assist with identification of specific Stressors or current issues interfering with healing process. Setting desired goal for each stressor or current issue identified.;Long Term Goal: Stressors or current issues are controlled or eliminated.;Short Term goal: Identification and review with participant of any Quality of Life or Depression concerns found by scoring the questionnaire.;Long Term goal: The participant improves quality of Life and PHQ9 Scores as seen by post scores and/or verbalization of changes          Quality of Life Scores:   Quality of Life - 06/11/23 1747       Quality of Life   Select Quality of Life      Quality of Life Scores   Health/Function Pre 26.57 %    Socioeconomic Pre 29.64 %    Psych/Spiritual Pre 30 %    Family Pre 30 %    GLOBAL Pre 28.37 %         Scores of 19 and below usually indicate a poorer quality of life in these areas.  A difference of  2-3 points is a clinically meaningful difference.  A difference of 2-3 points in the total score of the Quality of Life Index has been associated with significant improvement in overall quality of life, self-image, physical symptoms, and general health in studies assessing change in quality of life.  PHQ-9: Review Flowsheet       06/06/2023  Depression screen PHQ 2/9  Decreased Interest 0  Down, Depressed, Hopeless 0  PHQ - 2 Score 0   Altered sleeping 0  Tired, decreased energy 1  Change in appetite 0  Feeling bad or failure about yourself  0  Trouble concentrating 0  Moving slowly or fidgety/restless 0  Suicidal thoughts 0  PHQ-9 Score 1  Difficult doing work/chores Not difficult at all   Interpretation of Total Score  Total Score Depression Severity:  1-4 = Minimal depression, 5-9 = Mild depression, 10-14 = Moderate depression, 15-19 = Moderately severe depression, 20-27 = Severe depression   Psychosocial Evaluation and Intervention:  Psychosocial Evaluation - 05/30/23 1348       Psychosocial Evaluation & Interventions   Interventions Encouraged to exercise with the program and follow exercise prescription;Relaxation education;Stress management education    Comments Gerald Garza has his wife and 4 dogs to look to  for support. He does not take anything for his mood and states no mental instability.    Expected Outcomes Short: Start [facility] to help with mood. Long: Maintain a healthy mental state    Continue Psychosocial Services  Follow up required by staff          Psychosocial Re-Evaluation:  Psychosocial Re-Evaluation     Row Name 07/25/23 1754 08/13/23 1014           Psychosocial Re-Evaluation   Current issues with None Identified None Identified      Comments Patient reports that he has no new concerns with sleep, stress, or mental health. He continues to have a good support system. Gerald Garza reports no stress or sleep concerns at this time. He is enjoying his retirement working a job he loves and states it isn't stressful. His wife and he have been working together to make healthy improvements. [facility] is [facility] [employer] [facility]      Expected Outcomes Short: continue to consistently exercise in cardiac rehab for mental health benefits. Long: maintain good mental health and sleep routine. Short: continue to attend cardiac rehab for exercise and education. Long: independenlty manage  positive habits      Interventions Encouraged to attend Cardiac Rehabilitation for the exercise Encouraged to attend Cardiac Rehabilitation for the exercise      Continue Psychosocial Services  Follow up required by staff Follow up required by staff         Psychosocial Discharge (Final Psychosocial Re-Evaluation):  Psychosocial Re-Evaluation - 08/13/23 1014       Psychosocial Re-Evaluation   Current issues with None Identified    Comments Gerald Garza reports no stress or sleep concerns at this time. He is enjoying his retirement working a job he loves and states it isn't stressful. His wife and he have been working together to make healthy improvements. He is looking forward to [facility] [employer] [facility] he [facility]    Expected Outcomes Short: continue to attend cardiac rehab for exercise and education. Long: independenlty manage positive habits    Interventions Encouraged to attend Cardiac Rehabilitation for the exercise    Continue Psychosocial Services  Follow up required by staff          Vocational Rehabilitation: Provide vocational rehab assistance to qualifying candidates.   Vocational Rehab Evaluation & Intervention:   Education: Education Goals: Education classes will be provided on a variety of topics geared toward better understanding of heart health and risk factor modification. Participant will state understanding/return demonstration of topics presented as noted by education test scores.  Learning Barriers/Preferences:  Learning Barriers/Preferences - 05/30/23 1344       Learning Barriers/Preferences   Learning Barriers None    Learning Preferences None          General Cardiac Education Topics:  AED/CPR: - Group verbal and written instruction with the use of models to demonstrate the basic use of the AED with the basic ABC's of resuscitation.   Anatomy and Cardiac Procedures: - Group verbal and visual presentation and models provide information about basic cardiac  anatomy and function. Reviews the testing methods done to diagnose heart disease and the outcomes of the test results. Describes the treatment choices: Medical Management, Angioplasty, or Coronary Bypass Surgery for treating various heart conditions including Myocardial Infarction, Angina, Valve Disease, and Cardiac Arrhythmias.  Written material given at graduation.   Medication Safety: - Group verbal and visual instruction to review commonly prescribed medications for heart  and lung disease. Reviews the medication, class of the drug, and side effects. Includes the steps to properly store meds and maintain the prescription regimen.  Written material given at graduation.   Intimacy: - Group verbal instruction through game format to discuss how heart and lung disease can affect sexual intimacy. Written material given at graduation..   Know Your Numbers and Heart Failure: - Group verbal and visual instruction to discuss disease risk factors for cardiac and pulmonary disease and treatment options.  Reviews associated critical values for Overweight/Obesity, Hypertension, Cholesterol, and Diabetes.  Discusses basics of heart failure: signs/symptoms and treatments.  Introduces Heart Failure Zone chart for action plan for heart failure.  Written material given at graduation.   Infection Prevention: - Provides verbal and written material to individual with discussion of infection control including proper hand washing and proper equipment cleaning during exercise session. Flowsheet Row Cardiac Rehab from 06/06/2023 in [facility] Cardiac and Pulmonary Rehab  Date 06/06/23  Educator [facility]  Instruction Review Code 1- Verbalizes Understanding    Falls Prevention: - Provides verbal and written material to individual with discussion of falls prevention and safety. Flowsheet Row Cardiac Rehab from 06/06/2023 in [facility] Cardiac and Pulmonary Rehab  Date 06/06/23  Educator [facility]  Instruction Review Code 1- Verbalizes  Understanding    Other: -Provides group and verbal instruction on various topics (see comments)   Knowledge Questionnaire Score:  Knowledge Questionnaire Score - 06/11/23 1747       Knowledge Questionnaire Score   Pre Score 21/26          Core Components/Risk Factors/Patient Goals at Admission:  Personal Goals and Risk Factors at Admission - 06/06/23 1212       Core Components/Risk Factors/Patient Goals on Admission   Intervention Weight Management: Develop a combined nutrition and exercise program designed to reach desired caloric intake, while maintaining appropriate intake of nutrient and fiber, sodium and fats, and appropriate energy expenditure required for the weight goal.;Weight Management: Provide education and appropriate resources to help participant work on and attain dietary goals.;Weight Management/Obesity: Establish reasonable short term and long term weight goals.;Obesity: Provide education and appropriate resources to help participant work on and attain dietary goals.    Expected Outcomes Short Term: Continue to assess and modify interventions until short term weight is achieved;Weight Loss: Understanding of general recommendations for a balanced deficit meal plan, which promotes 1-2 lb weight loss per week and includes a negative energy balance of [phone] kcal/d;Understanding recommendations for meals to include 15-35% energy as protein, 25-35% energy from fat, 35-60% energy from carbohydrates, less than 200mg  of dietary cholesterol, 20-35 gm of total fiber daily;Understanding of distribution of calorie intake throughout the day with the consumption of 4-5 meals/snacks;Long Term: Adherence to nutrition and physical activity/exercise program aimed toward attainment of established weight goal    Tobacco Cessation Yes    Number of packs per day .2    Intervention Assist the participant in steps to quit. Provide individualized education and counseling about committing to  Tobacco Cessation, relapse prevention, and pharmacological support that can be provided by physician.;Education officer, environmental, assist with locating and accessing local/national Quit Smoking programs, and support quit date choice.    Expected Outcomes Short Term: Will demonstrate readiness to quit, by selecting a quit date.;Short Term: Will quit all tobacco product use, adhering to prevention of relapse plan.;Long Term: Complete abstinence from all tobacco products for at least 12 months from quit date.    Hypertension Yes    Intervention Provide  education on lifestyle modifcations including regular physical activity/exercise, weight management, moderate sodium restriction and increased consumption of fresh fruit, vegetables, and low fat dairy, alcohol moderation, and smoking cessation.;Monitor prescription use compliance.    Expected Outcomes Short Term: Continued assessment and intervention until BP is < 140/18mm HG in hypertensive participants. < 130/40mm HG in hypertensive participants with diabetes, heart failure or chronic kidney disease.;Long Term: Maintenance of blood pressure at goal levels.    Lipids Yes    Intervention Provide education and support for participant on nutrition & aerobic/resistive exercise along with prescribed medications to achieve LDL 70mg , HDL >40mg .    Expected Outcomes Short Term: Participant states understanding of desired cholesterol values and is compliant with medications prescribed. Participant is following exercise prescription and nutrition guidelines.;Long Term: Cholesterol controlled with medications as prescribed, with individualized exercise RX and with personalized nutrition plan. Value goals: LDL < 70mg , HDL > 40 mg.          Education:Diabetes - Individual verbal and written instruction to review signs/symptoms of diabetes, desired ranges of glucose level fasting, after meals and with exercise. Acknowledge that pre and post exercise glucose checks  will be done for 3 sessions at entry of program.   Core Components/Risk Factors/Patient Goals Review:   Goals and Risk Factor Review     Row Name 06/06/23 1213 07/25/23 1745 08/13/23 1009         Core Components/Risk Factors/Patient Goals Review   Personal Goals Review Tobacco Cessation Tobacco Cessation;Weight Management/Obesity;Lipids;Hypertension Tobacco Cessation;Hypertension     Review Diontay Rosencrans is a current tobacco user. Intervention for tobacco cessation was provided at the initial medical review. He} was asked about readiness to quit and reported he has cut backto3-4 cigarettes a day and does not have a quit date yet but is planning to continue to cut back and eventually quit . Patient was advised and educated about tobacco cessation using combination therapy, tobacco cessation classes, quit line, and quit smoking apps. Patient demonstrated understanding of this material. Staff will continue to provide encouragement and follow up with the patient throughout the program Gerald Garza reports that he has gotten down to 2 cigarettes today, and is currently using the patch, which has been very helpful in reducing the number of cigarettes. He is thinking about picking a quit date but does not have one yet. He has been successfully working towards his weight loss goal. He stated he is down to 222 lbs from 233 lbs. He continues to work towards a weight goal of 205-210 lbs. He takes all his BP and cholesterol meds and follows up regularly with doctor for all required lab work. He does have a BP cuff at home and checks it over the weekend when he is not coming to rehab. He was encouraged to start getting into the habit of checking it consistently at home. Gerald Garza reports he has been consistently smoking 4 cigarettes daily. He is okay with the progress he has made to reduce his cig intake, but feels stuck. We discussed resources to help with quiting completely, and he states he knows it is up to him to officially  quit and he isn't totally ready just yet. He has been checking his blood pressure at home with his wife and it has been consistent. He plans to continue taking his blood pressure regularly. He recently had his check up with his primary and got very good results with his labs.     Expected Outcomes Short: continue to cut back and pick  a quit  date. Long: become tobacco free Short: pick a quit date for smoking cessation. 215 lbs short term weight goal. Start chekcing BP at home more consistently. Long: quit smoking, continue to control cardiac risk factors. Weight goal of 210 lbs. Short: attend cardiac rehab for education and review smoking cessation resources. Long: manage risk factors independently.        Core Components/Risk Factors/Patient Goals at Discharge (Final Review):   Goals and Risk Factor Review - 08/13/23 1009       Core Components/Risk Factors/Patient Goals Review   Personal Goals Review Tobacco Cessation;Hypertension    Review Gerald Garza reports he has been consistently smoking 4 cigarettes daily. He is okay with the progress he has made to reduce his cig intake, but feels stuck. We discussed resources to help with quiting completely, and he states he knows it is up to him to officially quit and he isn't totally ready just yet. He has been checking his blood pressure at home with his wife and it has been consistent. He plans to continue taking his blood pressure regularly. He recently had his check up with his primary and got very good results with his labs.    Expected Outcomes Short: attend cardiac rehab for education and review smoking cessation resources. Long: manage risk factors independently.          ITP Comments:  ITP Comments     Row Name 05/30/23 1343 06/06/23 1200 06/11/23 1732 06/13/23 1137 07/11/23 1316   ITP Comments Virtual Visit completed. Patient informed on EP and RD appointment and 6 Minute walk test. Patient also informed of patient health questionnaires on My  Chart. Patient Verbalizes understanding. Visit diagnosis can be found in [facility] 05/23/2023. Completed and gym orientation. Initial ITP created and sent for review to Dr. Oneil Pinal, Medical Director. Xavius Spadafore is a current tobacco user. Intervention for tobacco cessation was provided at the initial medical review. He} was asked about readiness to quit and reported he has cut backto3-4 cigarettes a day and does not have a quit date yet but is planning to continue to cut back and eventually quit . Patient was advised and educated about tobacco cessation using combination therapy, tobacco cessation classes, quit line, and quit smoking apps. Patient demonstrated understanding of this material. Staff will continue to provide encouragement and follow up with the patient throughout the program. First full day of exercise!  Patient was oriented to gym and equipment including functions, settings, policies, and procedures.  Patient's individual exercise prescription and treatment plan were reviewed.  All starting workloads were established based on the results of the 6 minute walk test done at initial orientation visit.  The plan for exercise progression was also introduced and progression will be customized based on patient's performance and goals. 30 Day review completed. Medical Director ITP review done, changes made as directed, and signed approval by Medical Director.   new to prgram 30 Day review completed. Medical Director ITP review done, changes made as directed, and signed approval by Medical Director.    Row Name 08/08/23 1110 09/05/23 1117         ITP Comments 30 Day review completed. Medical Director ITP review done, changes made as directed, and signed approval by Medical Director. 30 Day review completed. Medical Director ITP review done, changes made as directed, and signed approval by Medical Director.         Comments: 30 day review

## 2023-09-06 ENCOUNTER — Encounter: Attending: Cardiology | Admitting: *Deleted

## 2023-09-06 ENCOUNTER — Encounter

## 2023-09-06 DIAGNOSIS — I214 Non-ST elevation (NSTEMI) myocardial infarction: Secondary | ICD-10-CM | POA: Insufficient documentation

## 2023-09-06 DIAGNOSIS — Z955 Presence of coronary angioplasty implant and graft: Secondary | ICD-10-CM | POA: Diagnosis not present

## 2023-09-06 NOTE — Progress Notes (Signed)
 Daily Session Note  Patient Details  Name: Gerald Garza MRN: [id number] Date of Birth: [birth year] Referring Provider:   Flowsheet Row Cardiac Rehab from 06/06/2023 in [facility] Cardiac and Pulmonary Rehab  Referring Provider Dr. Alm Clay    Encounter Date: 09/06/2023  Check In:  Session Check In - 09/06/23 0936       Check-In   Supervising physician immediately available to respond to emergencies See telemetry face sheet for immediately available ER MD    Location [facility]-Cardiac & Pulmonary Rehab    Staff Present Selinda Pereyra RDN,LDN;Adynn Rolinda NORWOOD HARMAN SAMMIE;Othel Durand, RN, BSN, CCRP;Maxon Conetta BS, Exercise Physiologist    Virtual Visit No    Medication changes reported     No    Fall or balance concerns reported    No    Warm-up and Cool-down Performed on first and last piece of equipment    Resistance Training Performed Yes    VAD Patient? No    PAD/SET Patient? No      Pain Assessment   Currently in Pain? No/denies             Social History   Tobacco Use  Smoking Status Every Day   Current packs/day: 0.50   Average packs/day: 0.5 packs/day for 40.0 years (20.0 ttl pk-yrs)   Types: Cigarettes  Smokeless Tobacco Never    Goals Met:  Independence with exercise equipment Exercise tolerated well No report of concerns or symptoms today  Goals Unmet:  Not Applicable  Comments: Pt able to follow exercise prescription today without complaint.  Will continue to monitor for progression.    Dr. Oneil Pinal is Medical Director for [facility] Cardiac Rehabilitation.  Dr. Fuad Aleskerov is Medical Director for [facility] Pulmonary Rehabilitation.

## 2023-09-10 ENCOUNTER — Encounter

## 2023-09-11 ENCOUNTER — Encounter

## 2023-09-11 DIAGNOSIS — Z955 Presence of coronary angioplasty implant and graft: Secondary | ICD-10-CM | POA: Diagnosis not present

## 2023-09-11 DIAGNOSIS — I214 Non-ST elevation (NSTEMI) myocardial infarction: Secondary | ICD-10-CM

## 2023-09-11 NOTE — Progress Notes (Signed)
 Daily Session Note  Patient Details  Name: Gerald Garza MRN: [id number] Date of Birth: 1961-02-14 Referring Provider:   Flowsheet Row Cardiac Rehab from 06/06/2023 in [facility] Cardiac and Pulmonary Rehab  Referring Provider Dr. Alm Clay    Encounter Date: 09/11/2023  Check In:  Session Check In - 09/11/23 0914       Check-In   Supervising physician immediately available to respond to emergencies See telemetry face sheet for immediately available ER MD    Location [facility]-Cardiac & Pulmonary Rehab    Staff Present Rollene Paterson, MS, Exercise Physiologist;Jason Elnor RDN,LDN;Ari Bernabei Dyane BS, ACSM CEP, Exercise Physiologist;Precilla Purnell [facility]    Virtual Visit No    Medication changes reported     No    Fall or balance concerns reported    No    Tobacco Cessation No Change    Warm-up and Cool-down Performed on first and last piece of equipment    Resistance Training Performed Yes    VAD Patient? No    PAD/SET Patient? No      Pain Assessment   Currently in Pain? No/denies             Social History   Tobacco Use  Smoking Status Every Day   Current packs/day: 0.50   Average packs/day: 0.5 packs/day for 40.0 years (20.0 ttl pk-yrs)   Types: Cigarettes  Smokeless Tobacco Never    Goals Met:  Independence with exercise equipment Exercise tolerated well No report of concerns or symptoms today Strength training completed today  Goals Unmet:  Not Applicable  Comments: Pt able to follow exercise prescription today without complaint.  Will continue to monitor for progression.    Dr. Oneil Pinal is Medical Director for [facility] Cardiac Rehabilitation.  Dr. Fuad Aleskerov is Medical Director for [facility] Pulmonary Rehabilitation.

## 2023-09-12 ENCOUNTER — Encounter

## 2023-09-13 ENCOUNTER — Encounter

## 2023-09-17 ENCOUNTER — Encounter: Admitting: *Deleted

## 2023-09-17 VITALS — Ht 71.5 in | Wt 223.0 lb

## 2023-09-17 DIAGNOSIS — I214 Non-ST elevation (NSTEMI) myocardial infarction: Secondary | ICD-10-CM | POA: Diagnosis not present

## 2023-09-17 DIAGNOSIS — Z955 Presence of coronary angioplasty implant and graft: Secondary | ICD-10-CM | POA: Diagnosis not present

## 2023-09-17 NOTE — Progress Notes (Signed)
 Daily Session Note  Patient Details  Name: Gerald Garza MRN: [id number] Date of Birth: 02-Sep-1960 Referring Provider:   Flowsheet Row Cardiac Rehab from 06/06/2023 in [facility] Cardiac and Pulmonary Rehab  Referring Provider Dr. Alm Clay    Encounter Date: 09/17/2023  Check In:  Session Check In - 09/17/23 1714       Check-In   Supervising physician immediately available to respond to emergencies See telemetry face sheet for immediately available ER MD    Location [facility]-Cardiac & Pulmonary Rehab    Staff Present [name] RN,BSN;Oneill Rolinda RCP,RRT,BSRT;Kelly Bollinger [facility]    Virtual Visit No    Medication changes reported     No    Fall or balance concerns reported    No    Warm-up and Cool-down Performed on first and last piece of equipment    Resistance Training Performed Yes    VAD Patient? No    PAD/SET Patient? No      Pain Assessment   Currently in Pain? No/denies             Social History   Tobacco Use  Smoking Status Every Day   Current packs/day: 0.50   Average packs/day: 0.5 packs/day for 40.0 years (20.0 ttl pk-yrs)   Types: Cigarettes  Smokeless Tobacco Never    Goals Met:  Independence with exercise equipment Exercise tolerated well No report of concerns or symptoms today Strength training completed today  Goals Unmet:  Not Applicable  Comments:   6 Minute Walk     Row Name 06/06/23 1202 09/17/23 1727       6 Minute Walk   Phase Initial Discharge    Distance 1160 feet 1400 feet    Distance % Change -- 21 %    Distance Feet Change -- 240 ft    Walk Time 6 minutes 6 minutes    # of Rest Breaks 0 0    MPH 2.2 2.65    METS 2.76 3.37    RPE 7 11    Perceived Dyspnea  0 0    VO2 Peak 9.66 11.81    Symptoms No No    Resting HR 73 bpm 75 bpm    Resting BP 130/80 140/78    Resting Oxygen Saturation  96 % 95 %    Exercise Oxygen Saturation  during 6 min walk 96 % 96 %    Max Ex. HR 89 bpm 96 bpm    Max Ex. BP 150/80  160/76    2 Minute Post BP 124/80 --      Pt able to follow exercise prescription today without complaint.  Will continue to monitor for progression.    Dr. Oneil Pinal is Medical Director for [facility] Cardiac Rehabilitation.  Dr. Fuad Aleskerov is Medical Director for [facility] Pulmonary Rehabilitation.

## 2023-09-17 NOTE — Patient Instructions (Signed)
 Discharge Patient Instructions  Patient Details  Name: PADRAIG NHAN MRN: [id number] Date of Birth: [birth year] Referring Provider:  No ref. provider found Number of Visits: 36/36 Reason for Discharge:  Patient reached a stable level of exercise. Patient independent in their exercise. Patient has met program and personal goals. Diagnosis:  NSTEMI (non-ST elevated myocardial infarction) [facility])  Status post coronary artery stent placement Initial Exercise Prescription:  Initial Exercise Prescription - 06/06/23 1200       Date of Initial Exercise RX and Referring Provider   Date 06/06/23    Referring Provider Dr. Alm Clay      Oxygen   Maintain Oxygen Saturation 88% or higher      Treadmill   MPH 2    Grade 1    Minutes 15    METs 2.81      REL-XR   Level 2    Speed 50    Minutes 15    METs 2.76      T5 Nustep   Level 2    SPM 80    Minutes 15    METs 2.76      Prescription Details   Frequency (times per week) 3    Duration Progress to 30 minutes of continuous aerobic without signs/symptoms of physical distress      Intensity   THRR 40-80% of Max Heartrate 105-138    Ratings of Perceived Exertion 11-13    Perceived Dyspnea 0-4      Progression   Progression Continue to progress workloads to maintain intensity without signs/symptoms of physical distress.      Resistance Training   Training Prescription Yes    Weight 5    Reps 10-15         Discharge Exercise Prescription (Final Exercise Prescription Changes):  Exercise Prescription Changes - 09/05/23 0700       Response to Exercise   Blood Pressure (Admit) 130/82    Blood Pressure (Exit) 108/64    Heart Rate (Admit) 79 bpm    Heart Rate (Exercise) 98 bpm    Heart Rate (Exit) 84 bpm    Rating of Perceived Exertion (Exercise) 12    Symptoms none    Duration Continue with 30 min of aerobic exercise without signs/symptoms of physical distress.    Intensity THRR unchanged      Progression    Progression Continue to progress workloads to maintain intensity without signs/symptoms of physical distress.    Average METs 3.35      Resistance Training   Training Prescription Yes    Weight 7 lb    Reps 10-15      Interval Training   Interval Training No      NuStep   Level 5    Minutes 15    METs 5.3      Arm Ergometer   Level 1    Minutes 15    METs 3      T5 Nustep   Level 4    Minutes 15    METs 3      Home Exercise Plan   Plans to continue exercise at [employer] (comment)   plans to join [employer]   Frequency Add 2 additional days to program exercise sessions.    Initial Home Exercises Provided 07/25/23      Oxygen   Maintain Oxygen Saturation 88% or higher         Functional Capacity:  6 Minute Walk     Row Name 06/06/23  1202 09/17/23 1727       6 Minute Walk   Phase Initial Discharge    Distance 1160 feet 1400 feet    Distance % Change -- 21 %    Distance Feet Change -- 240 ft    Walk Time 6 minutes 6 minutes    # of Rest Breaks 0 0    MPH 2.2 2.65    METS 2.76 3.37    RPE 7 11    Perceived Dyspnea  0 0    VO2 Peak 9.66 11.81    Symptoms No No    Resting HR 73 bpm 75 bpm    Resting BP 130/80 140/78    Resting Oxygen Saturation  96 % 95 %    Exercise Oxygen Saturation  during 6 min walk 96 % 96 %    Max Ex. HR 89 bpm 96 bpm    Max Ex. BP 150/80 160/76    2 Minute Post BP 124/80 --      Nutrition & Weight - Outcomes:  Pre Biometrics - 06/06/23 1211       Pre Biometrics   Height 5' 11.5 (1.816 m)    Weight 228 lb 1.6 oz (103.5 kg)    Waist Circumference 44 inches    Hip Circumference 44.5 inches    Waist to Hip Ratio 0.99 %    BMI (Calculated) 31.37    Single Leg Stand 15.68 seconds          Post Biometrics - 09/17/23 1730        Post  Biometrics   Height 5' 11.5 (1.816 m)    Weight 223 lb (101.2 kg)    Waist Circumference 44 inches    Hip Circumference 44.5 inches    Waist to Hip Ratio 0.99 %    BMI (Calculated)  30.67    Single Leg Stand 30 seconds

## 2023-09-19 ENCOUNTER — Encounter

## 2023-09-19 DIAGNOSIS — Z955 Presence of coronary angioplasty implant and graft: Secondary | ICD-10-CM

## 2023-09-19 DIAGNOSIS — I214 Non-ST elevation (NSTEMI) myocardial infarction: Secondary | ICD-10-CM | POA: Diagnosis not present

## 2023-09-19 NOTE — Progress Notes (Signed)
 Daily Session Note  Patient Details  Name: Gerald Garza MRN: [id number] Date of Birth: 09/11/1960 Referring Provider:   Flowsheet Row Cardiac Rehab from 06/06/2023 in [facility] Cardiac and Pulmonary Rehab  Referring Provider Dr. Alm Clay    Encounter Date: 09/19/2023  Check In:  Session Check In - 09/19/23 1707       Check-In   Supervising physician immediately available to respond to emergencies See telemetry face sheet for immediately available ER MD    Location [facility]-Cardiac & Pulmonary Rehab    Staff Present Burnard Davenport RN,BSN,MPA;Jakolby [facility] Dyane BS, ACSM CEP, Exercise Physiologist    Virtual Visit No    Medication changes reported     No    Fall or balance concerns reported    No    Tobacco Cessation No Change    Warm-up and Cool-down Performed on first and last piece of equipment    Resistance Training Performed Yes    VAD Patient? No    PAD/SET Patient? No      Pain Assessment   Currently in Pain? No/denies             Social History   Tobacco Use  Smoking Status Every Day   Current packs/day: 0.50   Average packs/day: 0.5 packs/day for 40.0 years (20.0 ttl pk-yrs)   Types: Cigarettes  Smokeless Tobacco Never    Goals Met:  Independence with exercise equipment Exercise tolerated well No report of concerns or symptoms today Strength training completed today  Goals Unmet:  Not Applicable  Comments: Pt able to follow exercise prescription today without complaint.  Will continue to monitor for progression.    Dr. Oneil Pinal is Medical Director for [facility] Cardiac Rehabilitation.  Dr. Fuad Aleskerov is Medical Director for [facility] Pulmonary Rehabilitation.

## 2023-09-20 ENCOUNTER — Encounter

## 2023-09-24 ENCOUNTER — Encounter

## 2023-09-24 DIAGNOSIS — Z955 Presence of coronary angioplasty implant and graft: Secondary | ICD-10-CM

## 2023-09-24 DIAGNOSIS — I214 Non-ST elevation (NSTEMI) myocardial infarction: Secondary | ICD-10-CM | POA: Diagnosis not present

## 2023-09-24 NOTE — Progress Notes (Signed)
 Daily Session Note  Patient Details  Name: SATOSHI KALAS MRN: [id number] Date of Birth: 23-Nov-1960 Referring Provider:   Flowsheet Row Cardiac Rehab from 06/06/2023 in [facility] Cardiac and Pulmonary Rehab  Referring Provider Dr. Alm Clay    Encounter Date: 09/24/2023  Check In:  Session Check In - 09/24/23 0907       Check-In   Supervising physician immediately available to respond to emergencies See telemetry face sheet for immediately available ER MD    Location [facility]-Cardiac & Pulmonary Rehab    Staff Present Burnard Davenport RN,BSN,MPA;Brandyn [facility] RCP,RRT,BSRT;Maxon Burnell BS, Exercise Physiologist;Delvin Hedeen Dyane BS, ACSM CEP, Exercise Physiologist    Virtual Visit No    Medication changes reported     No    Fall or balance concerns reported    No    Tobacco Cessation No Change    Warm-up and Cool-down Performed on first and last piece of equipment    Resistance Training Performed Yes    VAD Patient? No    PAD/SET Patient? No      Pain Assessment   Currently in Pain? No/denies             Social History   Tobacco Use  Smoking Status Every Day   Current packs/day: 0.50   Average packs/day: 0.5 packs/day for 40.0 years (20.0 ttl pk-yrs)   Types: Cigarettes  Smokeless Tobacco Never    Goals Met:  Independence with exercise equipment Exercise tolerated well No report of concerns or symptoms today Strength training completed today  Goals Unmet:  Not Applicable  Comments: Pt able to follow exercise prescription today without complaint.  Will continue to monitor for progression.    Dr. Oneil Pinal is Medical Director for [facility] Cardiac Rehabilitation.  Dr. Fuad Aleskerov is Medical Director for [facility] Pulmonary Rehabilitation.

## 2023-09-26 ENCOUNTER — Encounter

## 2023-09-27 ENCOUNTER — Encounter

## 2023-10-01 ENCOUNTER — Encounter: Admitting: *Deleted

## 2023-10-01 DIAGNOSIS — I214 Non-ST elevation (NSTEMI) myocardial infarction: Secondary | ICD-10-CM

## 2023-10-01 DIAGNOSIS — Z955 Presence of coronary angioplasty implant and graft: Secondary | ICD-10-CM

## 2023-10-01 NOTE — Progress Notes (Signed)
 Daily Session Note  Patient Details  Name: Gerald Garza MRN: [id number] Date of Birth: May 08, 1960 Referring Provider:   Flowsheet Row Cardiac Rehab from 06/06/2023 in [facility] Cardiac and Pulmonary Rehab  Referring Provider Dr. Alm Clay    Encounter Date: 10/01/2023  Check In:  Session Check In - 10/01/23 1725       Check-In   Supervising physician immediately available to respond to emergencies See telemetry face sheet for immediately available ER MD    Location [facility]-Cardiac & Pulmonary Rehab    Staff Present [name] RN,BSN;Kelly Dyane BS, ACSM CEP, Exercise Physiologist;Eldon Rolinda RCP,RRT,BSRT    Virtual Visit No    Medication changes reported     No    Fall or balance concerns reported    No    Warm-up and Cool-down Performed on first and last piece of equipment    Resistance Training Performed Yes    VAD Patient? No    PAD/SET Patient? No      Pain Assessment   Currently in Pain? No/denies             Social History   Tobacco Use  Smoking Status Every Day   Current packs/day: 0.50   Average packs/day: 0.5 packs/day for 40.0 years (20.0 ttl pk-yrs)   Types: Cigarettes  Smokeless Tobacco Never    Goals Met:  Independence with exercise equipment Exercise tolerated well No report of concerns or symptoms today Strength training completed today  Goals Unmet:  Not Applicable  Comments: Pt able to follow exercise prescription today without complaint.  Will continue to monitor for progression.    Dr. Oneil Pinal is Medical Director for [facility] Cardiac Rehabilitation.  Dr. Fuad Aleskerov is Medical Director for [facility] Pulmonary Rehabilitation.

## 2023-10-03 ENCOUNTER — Encounter

## 2023-10-03 DIAGNOSIS — Z955 Presence of coronary angioplasty implant and graft: Secondary | ICD-10-CM

## 2023-10-03 DIAGNOSIS — I214 Non-ST elevation (NSTEMI) myocardial infarction: Secondary | ICD-10-CM | POA: Diagnosis not present

## 2023-10-03 NOTE — Progress Notes (Signed)
 Cardiac Individual Treatment Plan  Patient Details  Name: Gerald Garza MRN: [id number] Date of Birth: 04-11-1960 Referring Provider:   Flowsheet Row Cardiac Rehab from 06/06/2023 in [facility] Cardiac and Pulmonary Rehab  Referring Provider Dr. Alm Clay    Initial Encounter Date:  Flowsheet Row Cardiac Rehab from 06/06/2023 in [facility] Cardiac and Pulmonary Rehab  Date 06/06/23    Visit Diagnosis: NSTEMI (non-ST elevated myocardial infarction) [facility])  Status post coronary artery stent placement  Patient's Home Medications on Admission:  Current Outpatient Medications:    amLODipine  (NORVASC ) 5 MG tablet, Take 1 tablet (5 mg total) by mouth daily., Disp: 90 tablet, Rfl: 3   aspirin  EC 81 MG tablet, Take 1 tablet (81 mg total) by mouth daily. Swallow whole., Disp: 30 tablet, Rfl: 12   fish oil-omega-3 fatty acids 1000 MG capsule, Take 2 g by mouth daily., Disp: , Rfl:    lisinopril -hydrochlorothiazide  (PRINZIDE ,ZESTORETIC ) 20-25 MG tablet, Take 1 tablet by mouth daily., Disp: , Rfl:    Magnesium 250 MG TABS, Take by mouth 2 (two) times daily., Disp: , Rfl:    metoprolol  succinate (TOPROL -XL) 25 MG 24 hr tablet, Take 0.5 tablets (12.5 mg total) by mouth daily., Disp: 30 tablet, Rfl: 3   Multiple Vitamin (MULTI-VITAMIN DAILY PO), Take by mouth., Disp: , Rfl:    prasugrel  (EFFIENT ) 10 MG TABS tablet, Take 1 tablet (10 mg total) by mouth daily., Disp: 90 tablet, Rfl: 3   rosuvastatin  (CRESTOR ) 20 MG tablet, Take 1 tablet (20 mg total) by mouth at bedtime., Disp: 90 tablet, Rfl: 3   vitamin B-12 (CYANOCOBALAMIN) 250 MCG tablet, Take 250 mcg by mouth daily., Disp: , Rfl:   Past Medical History: Past Medical History:  Diagnosis Date   Arthritis    CAD (coronary artery disease)    a. 05/2023 NSTEMI-->Myoview : high risk w/ septal/apical infarct/ischemia; b. 05/2023 Cath/PCI: LM nl, LAD 139m/d (2.5x22 Xience Frontier DES x 2), D2 50ost, LCX min irregs, RCA 20/20p-->12 mos DAPT followed by SAPT rec.    Diastolic dysfunction    a. 05/2023 Echo: EF 55-60%, no rwma, GrI DD, nl RV fxn, RVSP 22.1 mmHg.   Headache    hx cluster headaches   Hyperlipidemia LDL goal <70    Hypertension    Pneumonia 2015   bacterial    Tobacco Use: Social History   Tobacco Use  Smoking Status Every Day   Current packs/day: 0.50   Average packs/day: 0.5 packs/day for 40.0 years (20.0 ttl pk-yrs)   Types: Cigarettes  Smokeless Tobacco Never    Labs: Review Flowsheet       Latest Ref Rng & Units 05/23/2023 07/02/2023  Labs for ITP Cardiac and Pulmonary Rehab  Cholestrol 100 - 199 mg/dL 799  876   LDL (calc) 0 - 99 mg/dL 85  58   HDL-C >60 mg/dL 41  47   Trlycerides 0 - 149 mg/dL 631  95      Exercise Target Goals: Exercise Program Goal: Individual exercise prescription set using results from initial 6 min walk test and THRR while considering  patient's activity barriers and safety.   Exercise Prescription Goal: Initial exercise prescription builds to 30-45 minutes a day of aerobic activity, 2-3 days per week.  Home exercise guidelines will be given to patient during program as part of exercise prescription that the participant will acknowledge.   Education: Aerobic Exercise: - Group verbal and visual presentation on the components of exercise prescription. Introduces F.I.T.T principle from ACSM for exercise  prescriptions.  Reviews F.I.T.T. principles of aerobic exercise including progression. Written material given at graduation.   Education: Resistance Exercise: - Group verbal and visual presentation on the components of exercise prescription. Introduces F.I.T.T principle from ACSM for exercise prescriptions  Reviews F.I.T.T. principles of resistance exercise including progression. Written material given at graduation.    Education: Exercise & Equipment Safety: - Individual verbal instruction and demonstration of equipment use and safety with use of the equipment. Flowsheet Row Cardiac Rehab  from 06/06/2023 in [facility] Cardiac and Pulmonary Rehab  Date 06/06/23  Educator [facility]  Instruction Review Code 1- Verbalizes Understanding    Education: Exercise Physiology & General Exercise Guidelines: - Group verbal and written instruction with models to review the exercise physiology of the cardiovascular system and associated critical values. Provides general exercise guidelines with specific guidelines to those with heart or lung disease.    Education: Flexibility, Balance, Mind/Body Relaxation: - Group verbal and visual presentation with interactive activity on the components of exercise prescription. Introduces F.I.T.T principle from ACSM for exercise prescriptions. Reviews F.I.T.T. principles of flexibility and balance exercise training including progression. Also discusses the mind body connection.  Reviews various relaxation techniques to help reduce and manage stress (i.e. Deep breathing, progressive muscle relaxation, and visualization). Balance handout provided to take home. Written material given at graduation.   Activity Barriers & Risk Stratification:  Activity Barriers & Cardiac Risk Stratification - 06/06/23 1205       Activity Barriers & Cardiac Risk Stratification   Activity Barriers Left Knee Replacement;Right Knee Replacement;Other (comment)    Comments Left shoulder replacement. No issues with shoulder replacement. No pain with knee replacements but does have some limitied ROM    Cardiac Risk Stratification Moderate          6 Minute Walk:  6 Minute Walk     Row Name 06/06/23 1202 09/17/23 1727       6 Minute Walk   Phase Initial Discharge    Distance 1160 feet 1400 feet    Distance % Change -- 21 %    Distance Feet Change -- 240 ft    Walk Time 6 minutes 6 minutes    # of Rest Breaks 0 0    MPH 2.2 2.65    METS 2.76 3.37    RPE 7 11    Perceived Dyspnea  0 0    VO2 Peak 9.66 11.81    Symptoms No No    Resting HR 73 bpm 75 bpm    Resting BP 130/80 140/78     Resting Oxygen Saturation  96 % 95 %    Exercise Oxygen Saturation  during 6 min walk 96 % 96 %    Max Ex. HR 89 bpm 96 bpm    Max Ex. BP 150/80 160/76    2 Minute Post BP 124/80 --       Oxygen Initial Assessment:   Oxygen Re-Evaluation:   Oxygen Discharge (Final Oxygen Re-Evaluation):   Initial Exercise Prescription:  Initial Exercise Prescription - 06/06/23 1200       Date of Initial Exercise RX and Referring Provider   Date 06/06/23    Referring Provider Dr. Alm Clay      Oxygen   Maintain Oxygen Saturation 88% or higher      Treadmill   MPH 2    Grade 1    Minutes 15    METs 2.81      REL-XR   Level 2    Speed  50    Minutes 15    METs 2.76      T5 Nustep   Level 2    SPM 80    Minutes 15    METs 2.76      Prescription Details   Frequency (times per week) 3    Duration Progress to 30 minutes of continuous aerobic without signs/symptoms of physical distress      Intensity   THRR 40-80% of Max Heartrate 105-138    Ratings of Perceived Exertion 11-13    Perceived Dyspnea 0-4      Progression   Progression Continue to progress workloads to maintain intensity without signs/symptoms of physical distress.      Resistance Training   Training Prescription Yes    Weight 5    Reps 10-15          Perform Capillary Blood Glucose checks as needed.  Exercise Prescription Changes:   Exercise Prescription Changes     Row Name 06/06/23 1200 06/28/23 1700 07/12/23 1300 07/25/23 1700 07/26/23 1800     Response to Exercise   Blood Pressure (Admit) 130/80 122/76 118/70 -- 122/70   Blood Pressure (Exercise) 150/80 162/86 164/68 -- --   Blood Pressure (Exit) 124/80 118/68 118/64 -- 124/62   Heart Rate (Admit) 73 bpm 81 bpm 89 bpm -- 81 bpm   Heart Rate (Exercise) 89 bpm 118 bpm 112 bpm -- 108 bpm   Heart Rate (Exit) 75 bpm 97 bpm 84 bpm -- 73 bpm   Oxygen Saturation (Admit) 96 % -- -- -- --   Oxygen Saturation (Exercise) 96 % -- -- -- --    Oxygen Saturation (Exit) 97 % -- -- -- --   Rating of Perceived Exertion (Exercise) 7 13 15  -- 13   Perceived Dyspnea (Exercise) 0 -- -- -- --   Symptoms none none none -- none   Comments 6 MWT results First 2 weeks of exercise sessions -- -- --   Duration -- Continue with 30 min of aerobic exercise without signs/symptoms of physical distress. Continue with 30 min of aerobic exercise without signs/symptoms of physical distress. Continue with 30 min of aerobic exercise without signs/symptoms of physical distress. Continue with 30 min of aerobic exercise without signs/symptoms of physical distress.   Intensity -- THRR unchanged THRR unchanged THRR unchanged THRR unchanged     Progression   Progression -- Continue to progress workloads to maintain intensity without signs/symptoms of physical distress. Continue to progress workloads to maintain intensity without signs/symptoms of physical distress. Continue to progress workloads to maintain intensity without signs/symptoms of physical distress. Continue to progress workloads to maintain intensity without signs/symptoms of physical distress.   Average METs -- 2.7 3.74 3.74 2.96     Resistance Training   Training Prescription -- Yes Yes Yes Yes   Weight -- 5 5 5 5    Reps -- 10-15 10-15 10-15 10-15     Interval Training   Interval Training -- No No No No     Treadmill   MPH -- 2 -- -- --   Grade -- 1 -- -- --   Minutes -- 15 -- -- --   METs -- 2.81 -- -- --     NuStep   Level -- -- 4 4 5    Minutes -- -- 15 15 15    METs -- -- 5.8 5.8 5     Arm Ergometer   Level -- -- 1.5 1.5 2   Minutes -- -- 15 15  15   METs -- -- 1 1 1      REL-XR   Level -- 2 4 4 7    Minutes -- 14 15 15 15    METs -- 3.5 4.9 4.9 5.6     T5 Nustep   Level -- 3 3 3 3    Minutes -- 15 15 15 15    METs -- 2.3 3.6 3.6 3.1     Biostep-RELP   Level -- 4 -- -- --   SPM -- 50 -- -- --   Minutes -- 15 -- -- --   METs -- 3 -- -- --     Home Exercise Plan   Plans to  continue exercise at -- -- -- [employer] (comment)  plans to join [employer] (comment)  plans to join [employer] -- -- -- Add 2 additional days to program exercise sessions. Add 2 additional days to program exercise sessions.   Initial Home Exercises Provided -- -- -- 07/25/23 07/25/23     Oxygen   Maintain Oxygen Saturation -- 88% or higher 88% or higher 88% or higher 88% or higher    Row Name 08/08/23 1500 08/23/23 0900 09/05/23 0700 09/18/23 1500       Response to Exercise   Blood Pressure (Admit) 124/60 142/82 130/82 132/80    Blood Pressure (Exit) 126/72 124/72 108/64 122/82    Heart Rate (Admit) 84 bpm 76 bpm 79 bpm 79 bpm    Heart Rate (Exercise) 112 bpm 106 bpm 98 bpm 100 bpm    Heart Rate (Exit) 112 bpm 85 bpm 84 bpm 80 bpm    Oxygen Saturation (Admit) 94 % -- -- 97 %    Oxygen Saturation (Exercise) 95 % -- -- 92 %    Oxygen Saturation (Exit) 97 % -- -- 96 %    Rating of Perceived Exertion (Exercise) 15 13 12 13     Symptoms none none none none    Duration Continue with 30 min of aerobic exercise without signs/symptoms of physical distress. Continue with 30 min of aerobic exercise without signs/symptoms of physical distress. Continue with 30 min of aerobic exercise without signs/symptoms of physical distress. Continue with 30 min of aerobic exercise without signs/symptoms of physical distress.    Intensity THRR unchanged THRR unchanged THRR unchanged THRR unchanged      Progression   Progression Continue to progress workloads to maintain intensity without signs/symptoms of physical distress. Continue to progress workloads to maintain intensity without signs/symptoms of physical distress. Continue to progress workloads to maintain intensity without signs/symptoms of physical distress. Continue to progress workloads to maintain intensity without signs/symptoms of physical distress.    Average METs 4.65 2.15 3.35 2.98      Resistance Training   Training  Prescription Yes Yes Yes Yes    Weight 5 7 lb 7 lb 7 lb    Reps 10-15 10-15 10-15 10-15      Interval Training   Interval Training No No No No      NuStep   Level 5 -- 5 5    Minutes 15 -- 15 15    METs 5.7 -- 5.3 5.2      Arm Ergometer   Level 2 2 1 2     Minutes 15 15 15 15     METs -- 1 3 1       Elliptical   Level -- 1 -- --    Speed -- 0 -- --    Minutes -- 15 -- --  REL-XR   Level -- 3 -- --    Minutes -- 15 -- --    METs -- 3.6 -- --      T5 Nustep   Level 4 4 4 4     Minutes 15 15 15 15     METs -- 4.4 3 3       Home Exercise Plan   Plans to continue exercise at [employer] (comment)  plans to join [employer] (comment)  plans to join [employer] (comment)  plans to join [employer] (comment)  plans to join [employer] Add 2 additional days to program exercise sessions. Add 2 additional days to program exercise sessions. Add 2 additional days to program exercise sessions. Add 2 additional days to program exercise sessions.    Initial Home Exercises Provided 07/25/23 07/25/23 07/25/23 07/25/23      Oxygen   Maintain Oxygen Saturation 88% or higher 88% or higher 88% or higher 88% or higher       Exercise Comments:   Exercise Comments     Row Name 06/11/23 1733           Exercise Comments First full day of exercise!  Patient was oriented to gym and equipment including functions, settings, policies, and procedures.  Patient's individual exercise prescription and treatment plan were reviewed.  All starting workloads were established based on the results of the 6 minute walk test done at initial orientation visit.  The plan for exercise progression was also introduced and progression will be customized based on patient's performance and goals.          Exercise Goals and Review:   Exercise Goals     Row Name 06/06/23 1210             Exercise Goals   Increase Physical Activity Yes       Intervention  Provide advice, education, support and counseling about physical activity/exercise needs.;Develop an individualized exercise prescription for aerobic and resistive training based on initial evaluation findings, risk stratification, comorbidities and participant's personal goals.       Expected Outcomes Short Term: Attend rehab on a regular basis to increase amount of physical activity.;Long Term: Add in home exercise to make exercise part of routine and to increase amount of physical activity.;Long Term: Exercising regularly at least 3-5 days a week.       Increase Strength and Stamina Yes       Intervention Provide advice, education, support and counseling about physical activity/exercise needs.;Develop an individualized exercise prescription for aerobic and resistive training based on initial evaluation findings, risk stratification, comorbidities and participant's personal goals.       Expected Outcomes Short Term: Increase workloads from initial exercise prescription for resistance, speed, and METs.;Short Term: Perform resistance training exercises routinely during rehab and add in resistance training at home;Long Term: Improve cardiorespiratory fitness, muscular endurance and strength as measured by increased METs and functional capacity ( )       Able to understand and use rate of perceived exertion (RPE) scale Yes       Intervention Provide education and explanation on how to use RPE scale       Expected Outcomes Short Term: Able to use RPE daily in rehab to express subjective intensity level;Long Term:  Able to use RPE to guide intensity level when exercising independently       Able to understand and use Dyspnea scale Yes       Intervention Provide  education and explanation on how to use Dyspnea scale       Expected Outcomes Short Term: Able to use Dyspnea scale daily in rehab to express subjective sense of shortness of breath during exertion;Long Term: Able to use Dyspnea scale to guide  intensity level when exercising independently       Knowledge and understanding of Target Heart Rate Range (THRR) Yes       Intervention Provide education and explanation of THRR including how the numbers were predicted and where they are located for reference       Expected Outcomes Short Term: Able to state/look up THRR;Long Term: Able to use THRR to govern intensity when exercising independently;Short Term: Able to use daily as guideline for intensity in rehab       Able to check pulse independently Yes       Intervention Provide education and demonstration on how to check pulse in carotid and radial arteries.;Review the importance of being able to check your own pulse for safety during independent exercise       Expected Outcomes Short Term: Able to explain why pulse checking is important during independent exercise;Long Term: Able to check pulse independently and accurately       Understanding of Exercise Prescription Yes       Intervention Provide education, explanation, and written materials on patient's individual exercise prescription       Expected Outcomes Short Term: Able to explain program exercise prescription;Long Term: Able to explain home exercise prescription to exercise independently          Exercise Goals Re-Evaluation :  Exercise Goals Re-Evaluation     Row Name 06/11/23 1733 06/28/23 1751 07/12/23 1356 07/25/23 1744 07/26/23 1810     Exercise Goal Re-Evaluation   Exercise Goals Review Increase Physical Activity;Able to understand and use rate of perceived exertion (RPE) scale;Knowledge and understanding of Target Heart Rate Range (THRR);Understanding of Exercise Prescription;Increase Strength and Stamina;Able to check pulse independently Increase Physical Activity;Understanding of Exercise Prescription;Increase Strength and Stamina Increase Physical Activity;Understanding of Exercise Prescription;Increase Strength and Stamina Increase Physical Activity;Increase Strength and  Stamina;Able to understand and use rate of perceived exertion (RPE) scale;Able to understand and use Dyspnea scale;Knowledge and understanding of Target Heart Rate Range (THRR);Able to check pulse independently;Understanding of Exercise Prescription Increase Physical Activity;Understanding of Exercise Prescription;Increase Strength and Stamina   Comments Reviewed RPE and dyspnea scale, THR and program prescription with pt today.  Pt voiced understanding and was given a copy of goals to take home. Joe is off to a good start in the program and finished his first 2 weeks in this review. He did the treadmill at a speed of 2. with 1% incline. He did level 2 on the XR and level 3 on the T5 nustep. He also added the biostep to his prescription at level 4. We will continue to monitor his progress in the program. Larnell is doing well in rehab. He recently able to increase from level 2 to 4 on the XR. He was also able to maintain level 4 on the T4 nustep. We will continue to monitor his progress in the program. Reviewed home exercise with pt today.  Pt plans to join the [facility]  for exercise.  Reviewed THR, pulse, RPE, sign and symptoms, pulse oximetery and when to call 911 or MD.  Also discussed weather considerations and indoor options.  Pt voiced understanding. Joe continues to do well in rehab. He increased to level 5 on the T4 nustep,  level 7 on the XR, and level 2 on the arm ergometer. We will continue to monitor his progress in the program.   Expected Outcomes Short: Use RPE daily to regulate intensity.  Long: Follow program prescription in THR. Short: Continue to follow current exercise prescription. Long: Continue exercise to improve strength and stamina. Short: Continue to follow current exercise prescription. Long: Continue exercise to improve strength and stamina. Short: add 1-2 days a week of exercise at home on off days of rehab. Join [employer]. Long: maintain independent exercise routine. Short: Continue to  progressively increase nustep, XR, and arm ergometer workloads. Long: Continue exercise to improve strength and stamina.    Row Name 08/08/23 1536 08/13/23 1023 08/23/23 0907 09/05/23 0800 09/18/23 1528     Exercise Goal Re-Evaluation   Exercise Goals Review Increase Physical Activity;Understanding of Exercise Prescription;Increase Strength and Stamina Increase Physical Activity;Understanding of Exercise Prescription Increase Physical Activity;Understanding of Exercise Prescription;Increase Strength and Stamina Increase Physical Activity;Understanding of Exercise Prescription;Increase Strength and Stamina Increase Physical Activity;Understanding of Exercise Prescription;Increase Strength and Stamina   Comments Larnell is doing well in rehab. He was recently able to increase from level 3 to 4 on the T5 nustep. He has been able to maintain level 4 on the T5 nustep and level 2 on the Arm ergometer. We will continue to monitor his progress in the program. Larnell is planning on joining the [facility] when he is closer to graduating. He says he lives 2 minutes away and thinks he can manage a consistent exercise routine with his work schedule. He wants to start walking more on his off days from the program Larnell is doing well in rehab. He continues to work at level 2 on the arm crank and level 4 on the T5 nustep. He also increased to 7 lb hand weights for resistance training. We will continue to monitor his progress in the progran. Joe continues to do well in rehab. He continues to work at level 5 on the T4 nustep and level 4 on the T5 nustep. He is also due for his post and will look to improve on it. We will continue to monitor his progress in the progran. Larnell is doing well in rehab. He was able to maintain his workload on both the T4 nustep and T5 nustep, at level 5 and 4. He was also able to use the arm ergometer at level 2. We will continue to monitor his progress in the program.   Expected Outcomes Short: Continue to  increase T4 nustep workloads. Long: Continue exercise to improve strength and stamina. Short: walk on his off days from the program and look in to [facility] [employer]. Long: maintain an exercise routine. Short: Continue to progressively increase workloads. Long: Continue exercise to improve strength and stamina. Short: Improve on post . Long: Continue exercise to improve strength and stamina. Short: Improve on post . Long: Continue exercise to improve strength and stamina.    Row Name 10/01/23 1741             Exercise Goal Re-Evaluation   Exercise Goals Review Increase Physical Activity;Understanding of Exercise Prescription;Increase Strength and Stamina       Comments Joe going to workout at [facility] after [facility].       Expected Outcomes Short: workout at the [facility] Long: maintain exercise independently          Discharge Exercise Prescription (Final Exercise Prescription Changes):  Exercise Prescription Changes - 09/18/23 1500  Response to Exercise   Blood Pressure (Admit) 132/80    Blood Pressure (Exit) 122/82    Heart Rate (Admit) 79 bpm    Heart Rate (Exercise) 100 bpm    Heart Rate (Exit) 80 bpm    Oxygen Saturation (Admit) 97 %    Oxygen Saturation (Exercise) 92 %    Oxygen Saturation (Exit) 96 %    Rating of Perceived Exertion (Exercise) 13    Symptoms none    Duration Continue with 30 min of aerobic exercise without signs/symptoms of physical distress.    Intensity THRR unchanged      Progression   Progression Continue to progress workloads to maintain intensity without signs/symptoms of physical distress.    Average METs 2.98      Resistance Training   Training Prescription Yes    Weight 7 lb    Reps 10-15      Interval Training   Interval Training No      NuStep   Level 5    Minutes 15    METs 5.2      Arm Ergometer   Level 2    Minutes 15    METs 1      T5 Nustep   Level 4    Minutes 15    METs 3      Home Exercise Plan    Plans to continue exercise at [employer] (comment)   plans to join [employer]   Frequency Add 2 additional days to program exercise sessions.    Initial Home Exercises Provided 07/25/23      Oxygen   Maintain Oxygen Saturation 88% or higher          Nutrition:  Target Goals: Understanding of nutrition guidelines, daily intake of sodium 1500mg , cholesterol 200mg , calories 30% from fat and 7% or less from saturated fats, daily to have 5 or more servings of fruits and vegetables.  Education: All About Nutrition: -Group instruction provided by verbal, written material, interactive activities, discussions, models, and posters to present general guidelines for heart healthy nutrition including fat, fiber, MyPlate, the role of sodium in heart healthy nutrition, utilization of the nutrition label, and utilization of this knowledge for meal planning. Follow up email sent as well. Written material given at graduation.   Biometrics:  Pre Biometrics - 06/06/23 1211       Pre Biometrics   Height 5' 11.5 (1.816 m)    Weight 228 lb 1.6 oz (103.5 kg)    Waist Circumference 44 inches    Hip Circumference 44.5 inches    Waist to Hip Ratio 0.99 %    BMI (Calculated) 31.37    Single Leg Stand 15.68 seconds          Post Biometrics - 09/17/23 1730        Post  Biometrics   Height 5' 11.5 (1.816 m)    Weight 223 lb (101.2 kg)    Waist Circumference 44 inches    Hip Circumference 44.5 inches    Waist to Hip Ratio 0.99 %    BMI (Calculated) 30.67    Single Leg Stand 30 seconds          Nutrition Therapy Plan and Nutrition Goals:  Nutrition Therapy & Goals - 06/18/23 1732       Nutrition Therapy   Diet Cardiac, Low Na    Protein (specify units) 80-100    Fiber 30 grams    Whole Grain Foods 3 servings    Saturated Fats  15 max. grams    Fruits and Vegetables 5 servings/day    Sodium 2 grams      Personal Nutrition Goals   Nutrition Goal Reduce saturated fat, less than  12g per day. Replace bad fats for more heart healthy fats.    Personal Goal #2 Read labels and reduce sodium intake to below 2300mg . Ideally 1500mg  per day.    Personal Goal #3 Eat 15-30gProtein and 30-60gCarbs at each meal.    Comments Patient drinking lots of water when at work. But when he is home, he drinks lots of milk. Says he has been going through 2 gallons per week. Drinks some lite beers as well. Spoke with him about reducing fat content of milk, he says he used to drink whole milk but has cut down to 2%. Commended him on this change and encouraged him to continue to reduce fat percentage or reduce how much he is drinking. He will try to drink 1 gallon per week. Educated on identifying nutrients of concern like saturated fat and sodium. Provided guideline limits to sodium and saturated fat, 1500mg  and 15gSat fat respectively. Talked with him about his diet, sodium will need to be monitored and encouraged him to read labels to prevent getting surprised my foods that have way too much sodium. He also likes a lot of saturated rich foods. He may need to work on decreasing his saturated fat intake more. Encouraged and supported him on this journey to make better changes. Reviewed mediterranean diet handout. Educated on types of fats, sources, and how to read labels. Together, looked at several facts labels of foods he likes. Walked though label evaluations with aim to better his understanding and confidence in reading labels independently. Brainstormed several meals and snacks with foods he likes and will eat.      Intervention Plan   Intervention Prescribe, educate and counsel regarding individualized specific dietary modifications aiming towards targeted core components such as weight, hypertension, lipid management, diabetes, heart failure and other comorbidities.;Nutrition handout(s) given to patient.    Expected Outcomes Short Term Goal: Understand basic principles of dietary content, such as  calories, fat, sodium, cholesterol and nutrients.;Short Term Goal: A plan has been developed with personal nutrition goals set during dietitian appointment.;Long Term Goal: Adherence to prescribed nutrition plan.          Nutrition Assessments:  MEDIFICTS Score Key: >=70 Need to make dietary changes  40-70 Heart Healthy Diet <= 40 Therapeutic Level Cholesterol Diet  Flowsheet Row Cardiac Rehab from 09/19/2023 in [facility]  Picture Your Plate Total Score on Admission 50  Picture Your Plate Total Score on Discharge 66   Picture Your Plate Scores: <59 Unhealthy dietary pattern with much room for improvement. 41-50 Dietary pattern unlikely to meet recommendations for good health and room for improvement. 51-60 More healthful dietary pattern, with some room for improvement.  >60 Healthy dietary pattern, although there may be some specific behaviors that could be improved.    Nutrition Goals Re-Evaluation:  Nutrition Goals Re-Evaluation     Row Name 07/25/23 1752 08/13/23 1021 10/01/23 1737         Goals   Nutrition Goal Reduce saturated fat, less than 12g per day. Replace bad fats for more heart healthy fats. Reduce saturated fat, less than 12g per day. Replace bad fats for more heart healthy fats. --     Comment Patient reports that he is reading food labels and working on reducing sodium and saturated fats  in his diet. He does include lean protein in diet and is trying to make nutritional changes that have helped in his weight loss goal. Larnell reports his wife and he have been watching food labels and cooking healthier foods.They are choosing foods with lower saturated fats and improving their protein intake. Patient states that he is making healthy choices with his food and is eating alot vegetables.     Expected Outcome Short: continue to work towards weight loss goal of 210 lbs. Long: maintain heart healthy diet. Short: continue to choose heart healthy foods.  Long: independently manage heart healthy diet Short: maintain eating a heart healthy diet. Long: watchs labels and sodium intake independently.       Personal Goal #2 Re-Evaluation   Personal Goal #2 Read labels and reduce sodium intake to below 2300mg . Ideally 1500mg  per day. -- --       Personal Goal #3 Re-Evaluation   Personal Goal #3 Eat 15-30gProtein and 30-60gCarbs at each meal. -- --        Nutrition Goals Discharge (Final Nutrition Goals Re-Evaluation):  Nutrition Goals Re-Evaluation - 10/01/23 1737       Goals   Comment Patient states that he is making healthy choices with his food and is eating alot vegetables.    Expected Outcome Short: maintain eating a heart healthy diet. Long: watchs labels and sodium intake independently.          Psychosocial: Target Goals: Acknowledge presence or absence of significant depression and/or stress, maximize coping skills, provide positive support system. Participant is able to verbalize types and ability to use techniques and skills needed for reducing stress and depression.   Education: Stress, Anxiety, and Depression - Group verbal and visual presentation to define topics covered.  Reviews how body is impacted by stress, anxiety, and depression.  Also discusses healthy ways to reduce stress and to treat/manage anxiety and depression.  Written material given at graduation.   Education: Sleep Hygiene -Provides group verbal and written instruction about how sleep can affect your health.  Define sleep hygiene, discuss sleep cycles and impact of sleep habits. Review good sleep hygiene tips.    Initial Review & Psychosocial Screening:  Initial Psych Review & Screening - 05/30/23 1346       Initial Review   Current issues with None Identified      Family Dynamics   Good Support System? Yes    Comments Larnell has his wife and 4 dogs to look to for support. He does not take anything for his mood and states no mental instability.       Barriers   Psychosocial barriers to participate in program There are no identifiable barriers or psychosocial needs.;The patient should benefit from training in stress management and relaxation.      Screening Interventions   Interventions Encouraged to exercise;To provide support and resources with identified psychosocial needs;Provide feedback about the scores to participant    Expected Outcomes Short Term goal: Utilizing psychosocial counselor, staff and physician to assist with identification of specific Stressors or current issues interfering with healing process. Setting desired goal for each stressor or current issue identified.;Long Term Goal: Stressors or current issues are controlled or eliminated.;Short Term goal: Identification and review with participant of any Quality of Life or Depression concerns found by scoring the questionnaire.;Long Term goal: The participant improves quality of Life and PHQ9 Scores as seen by post scores and/or verbalization of changes          Quality  of Life Scores:   Quality of Life - 09/19/23 1728       Quality of Life Scores   Health/Function Pre 26.57 %    Health/Function Post 25.4 %    Health/Function % Change -4.4 %    Socioeconomic Pre 29.64 %    Socioeconomic Post 27.81 %    Socioeconomic % Change  -6.17 %    Psych/Spiritual Pre 30 %    Psych/Spiritual Post 30 %    Psych/Spiritual % Change 0 %    Family Pre 30 %    Family Post 27.1 %    Family % Change -9.67 %    GLOBAL Pre 28.37 %    GLOBAL Post 27.11 %    GLOBAL % Change -4.44 %         Scores of 19 and below usually indicate a poorer quality of life in these areas.  A difference of  2-3 points is a clinically meaningful difference.  A difference of 2-3 points in the total score of the Quality of Life Index has been associated with significant improvement in overall quality of life, self-image, physical symptoms, and general health in studies assessing change in quality of  life.  PHQ-9: Review Flowsheet       09/19/2023 06/06/2023  Depression screen PHQ 2/9  Decreased Interest 0 0  Down, Depressed, Hopeless 0 0  PHQ - 2 Score 0 0  Altered sleeping 1 0  Tired, decreased energy 2 1  Change in appetite 0 0  Feeling bad or failure about yourself  0 0  Trouble concentrating 0 0  Moving slowly or fidgety/restless 0 0  Suicidal thoughts 0 0  PHQ-9 Score 3 1  Difficult doing work/chores Not difficult at all Not difficult at all   Interpretation of Total Score  Total Score Depression Severity:  1-4 = Minimal depression, 5-9 = Mild depression, 10-14 = Moderate depression, 15-19 = Moderately severe depression, 20-27 = Severe depression   Psychosocial Evaluation and Intervention:  Psychosocial Evaluation - 05/30/23 1348       Psychosocial Evaluation & Interventions   Interventions Encouraged to exercise with the program and follow exercise prescription;Relaxation education;Stress management education    Comments Larnell has his wife and 4 dogs to look to for support. He does not take anything for his mood and states no mental instability.    Expected Outcomes Short: Start [facility] to help with mood. Long: Maintain a healthy mental state    Continue Psychosocial Services  Follow up required by staff          Psychosocial Re-Evaluation:  Psychosocial Re-Evaluation     Row Name 07/25/23 1754 08/13/23 1014 10/01/23 1735         Psychosocial Re-Evaluation   Current issues with None Identified None Identified None Identified     Comments Patient reports that he has no new concerns with sleep, stress, or mental health. He continues to have a good support system. Joe reports no stress or sleep concerns at this time. He is enjoying his retirement working a job he loves and states it isn't stressful. His wife and he have been working together to make healthy improvements. [facility] is [facility] [employer] [facility] Joe states no issues with is mood  and has a positive outlook on his health.     Expected Outcomes Short: continue to consistently exercise in cardiac rehab for mental health benefits. Long: maintain good mental health and sleep routine. Short: continue  to attend cardiac rehab for exercise and education. Long: independenlty manage positive habits Short: Grad[facility]. Long: maintain a healthy mental state     Interventions Encouraged to attend Cardiac Rehabilitation for the exercise Encouraged to attend Cardiac Rehabilitation for the exercise Encouraged to attend Cardiac Rehabilitation for the exercise     Continue Psychosocial Services  Follow up required by staff Follow up required by staff Follow up required by staff        Psychosocial Discharge (Final Psychosocial Re-Evaluation):  Psychosocial Re-Evaluation - 10/01/23 1735       Psychosocial Re-Evaluation   Current issues with None Identified    Comments Joe states no issues with is mood and has a positive outlook on his health.    Expected Outcomes Short: [facility]. Long: maintain a healthy mental state    Interventions Encouraged to attend Cardiac Rehabilitation for the exercise    Continue Psychosocial Services  Follow up required by staff          Vocational Rehabilitation: Provide vocational rehab assistance to qualifying candidates.   Vocational Rehab Evaluation & Intervention:   Education: Education Goals: Education classes will be provided on a variety of topics geared toward better understanding of heart health and risk factor modification. Participant will state understanding/return demonstration of topics presented as noted by education test scores.  Learning Barriers/Preferences:  Learning Barriers/Preferences - 05/30/23 1344       Learning Barriers/Preferences   Learning Barriers None    Learning Preferences None          General Cardiac Education Topics:  AED/CPR: - Group verbal and written instruction with the use of  models to demonstrate the basic use of the AED with the basic ABC's of resuscitation.   Anatomy and Cardiac Procedures: - Group verbal and visual presentation and models provide information about basic cardiac anatomy and function. Reviews the testing methods done to diagnose heart disease and the outcomes of the test results. Describes the treatment choices: Medical Management, Angioplasty, or Coronary Bypass Surgery for treating various heart conditions including Myocardial Infarction, Angina, Valve Disease, and Cardiac Arrhythmias.  Written material given at graduation.   Medication Safety: - Group verbal and visual instruction to review commonly prescribed medications for heart and lung disease. Reviews the medication, class of the drug, and side effects. Includes the steps to properly store meds and maintain the prescription regimen.  Written material given at graduation.   Intimacy: - Group verbal instruction through game format to discuss how heart and lung disease can affect sexual intimacy. Written material given at graduation..   Know Your Numbers and Heart Failure: - Group verbal and visual instruction to discuss disease risk factors for cardiac and pulmonary disease and treatment options.  Reviews associated critical values for Overweight/Obesity, Hypertension, Cholesterol, and Diabetes.  Discusses basics of heart failure: signs/symptoms and treatments.  Introduces Heart Failure Zone chart for action plan for heart failure.  Written material given at graduation.   Infection Prevention: - Provides verbal and written material to individual with discussion of infection control including proper hand washing and proper equipment cleaning during exercise session. Flowsheet Row Cardiac Rehab from 06/06/2023 in [facility] Cardiac and Pul[facility] Rehab  Date 06/06/23  Educator [facility]  Instruction Review Code 1- Verbalizes Understanding    Falls Prevention: - Provides verbal and written material to  individual with discussion of falls prevention and safety. Flowsheet Row Cardiac Rehab from 06/06/2023 in [facility] Cardiac [facility] Pulmonary Rehab  Date 06/06/23  Educator [facility]  Instruction  Review Code 1- Verbalizes Understanding    Other: -Provides group and verbal instruction on various topics (see comments)   Knowledge Questionnaire Score:  Knowledge Questionnaire Score - 09/19/23 1727       Knowledge Questionnaire Score   Pre Score 21/26    Post Score 23/26          Core Components/Risk Factors/Patient Goals at Admission:  Personal Goals and Risk Factors at Admission - 06/06/23 1212       Core Components/Risk Factors/Patient Goals on Admission   Intervention Weight Management: Develop a combined nutrition and exercise program designed to reach desired caloric intake, while maintaining appropriate intake of nutrient and fiber, sodium and fats, and appropriate energy expenditure required for the weight goal.;Weight Management: Provide education and appropriate resources to help participant work on and attain dietary goals.;Weight Management/Obesity: Establish reasonable short term and long term weight goals.;Obesity: Provide education and appropriate resources to help participant work on and attain dietary goals.    Expected Outcomes Short Term: Continue to assess and modify interventions until short term weight is achieved;Weight Loss: Understanding of general recommendations for a balanced deficit meal plan, which promotes 1-2 lb weight loss per week and includes a negative energy balance of [id number] kcal/d;Understanding recommendations for meals to include 15-35% energy as protein, 25-35% energy from fat, 35-60% energy from carbohydrates, less than 200mg  of dietary cholesterol, 20-35 gm of total fiber daily;Understanding of distribution of calorie intake throughout the day with the consumption of 4-5 meals/snacks;Long Term: Adherence to nutrition and physical activity/exercise program aimed  toward attainment of established weight goal    Tobacco Cessation Yes    Number of packs per day .2    Intervention Assist the participant in steps to quit. Provide individualized education and counseling about committing to Tobacco Cessation, relapse prevention, and pharmacological support that can be provided by physician.;Education officer, environmental, assist with locating and accessing local/national Quit Smoking programs, and support quit date choice.    Expected Outcomes Short Term: Will demonstrate readiness to quit, by selecting a quit date.;Short Term: Will quit all tobacco product use, adhering to prevention of relapse plan.;Long Term: Complete abstinence from all tobacco products for at least 12 months from quit date.    Hypertension Yes    Intervention Provide education on lifestyle modifcations including regular physical activity/exercise, weight management, moderate sodium restriction and increased consumption of fresh fruit, vegetables, and low fat dairy, alcohol moderation, and smoking cessation.;Monitor prescription use compliance.    Expected Outcomes Short Term: Continued assessment and intervention until BP is < 140/8mm HG in hypertensive participants. < 130/36mm HG in hypertensive participants with diabetes, heart failure or chronic kidney disease.;Long Term: Maintenance of blood pressure at goal levels.    Lipids Yes    Intervention Provide education and support for participant on nutrition & aerobic/resistive exercise along with prescribed medications to achieve LDL 70mg , HDL >40mg .    Expected Outcomes Short Term: Participant states understanding of desired cholesterol values and is compliant with medications prescribed. Participant is following exercise prescription and nutrition guidelines.;Long Term: Cholesterol controlled with medications as prescribed, with individualized exercise RX and with personalized nutrition plan. Value goals: LDL < 70mg , HDL > 40 mg.           Education:Diabetes - Individual verbal and written instruction to review signs/symptoms of diabetes, desired ranges of glucose level fasting, after meals and with exercise. Acknowledge that pre and post exercise glucose checks will be done for 3 sessions at entry of program.  Core Components/Risk Factors/Patient Goals Review:   Goals and Risk Factor Review     Row Name 06/06/23 1213 07/25/23 1745 08/13/23 1009 10/01/23 1739       Core Components/Risk Factors/Patient Goals Review   Personal Goals Review Tobacco Cessation Tobacco Cessation;Weight Management/Obesity;Lipids;Hypertension Tobacco Cessation;Hypertension Tobacco Cessation    Review Jeramy Dimmick is a current tobacco user. Intervention for tobacco cessation was provided at the initial medical review. He} was asked about readiness to quit and reported he has cut backto3-4 cigarettes a day and does not have a quit date yet but is planning to continue to cut back and eventually quit . Patient was advised and educated about tobacco cessation using combination therapy, tobacco cessation classes, quit line, and quit smoking apps. Patient demonstrated understanding of this material. Staff will continue to provide encouragement and follow up with the patient throughout the program Joe reports that he has gotten down to 2 cigarettes today, and is currently using the patch, which has been very helpful in reducing the number of cigarettes. He is thinking about picking a quit date but does not have one yet. He has been successfully working towards his weight loss goal. He stated he is down to 222 lbs from 233 lbs. He continues to work towards a weight goal of 205-210 lbs. He takes all his BP and cholesterol meds and follows up regularly with doctor for all required lab work. He does have a BP cuff at home and checks it over the weekend when he is not coming to rehab. He was encouraged to start getting into the habit of checking it consistently at  home. Joe reports he has been consistently smoking 4 cigarettes daily. He is okay with the progress he has made to reduce his cig intake, but feels stuck. We discussed resources to help with quiting completely, and he states he knows it is up to him to officially quit and he isn't totally ready just yet. He has been checking his blood pressure at home with his wife and it has been consistent. He plans to continue taking his blood pressure regularly. He recently had his check up with his primary and got very good results with his labs. Joe wants to quit smoking and after his vacation coming up he is going to try to quit [place]. He wants to do it and states he has the will. He has been consistant with exercise and will be consistant with quitting tobacco.    Expected Outcomes Short: continue to cut back and pick a quit  date. Long: become tobacco free Short: pick a quit date for smoking cessation. 215 lbs short term weight goal. Start chekcing BP at home more consistently. Long: quit smoking, continue to control cardiac risk factors. Weight goal of 210 lbs. Short: attend cardiac rehab for education and review smoking cessation resources. Long: manage risk factors independently. Short: reduce tobacco intake. Long: quit smoking.       Core Components/Risk Factors/Patient Goals at Discharge (Final Review):   Goals and Risk Factor Review - 10/01/23 1739       Core Components/Risk Factors/Patient Goals Review   Personal Goals Review Tobacco Cessation    Review Joe wants to quit smoking and after his vacation coming up he is going to try to quit [place]. He wants to do it and states he has the will. He has been consistant with exercise and will be consistant with quitting tobacco.    Expected Outcomes Short: reduce tobacco intake. Long:  quit smoking.          ITP Comments:  ITP Comments     Row Name 05/30/23 1343 06/06/23 1200 06/11/23 1732 06/13/23 1137 07/11/23 1316   ITP Comments Virtual  Visit completed. Patient informed on EP and RD appointment and 6 Minute walk test. Patient also informed of patient health questionnaires on My Chart. Patient Verbalizes understanding. Visit diagnosis can be found in [facility] 05/23/2023. Completed and gym orientation. Initial ITP created and sent for review to Dr. Oneil Pinal, Medical Director. Dadrian Ballantine is a current tobacco user. Intervention for tobacco cessation was provided at the initial medical review. He} was asked about readiness to quit and reported he has cut backto3-4 cigarettes a day and does not have a quit date yet but is planning to continue to cut back and eventually quit . Patient was advised and educated about tobacco cessation using combination therapy, tobacco cessation classes, quit line, and quit smoking apps. Patient demonstrated understanding of this material. Staff will continue to provide encouragement and follow up with the patient throughout the program. First full day of exercise!  Patient was oriented to gym and equipment including functions, settings, policies, and procedures.  Patient's individual exercise prescription and treatment plan were reviewed.  All starting workloads were established based on the results of the 6 minute walk test done at initial orientation visit.  The plan for exercise progression was also introduced and progression will be customized based on patient's performance and goals. 30 Day review completed. Medical Director ITP review done, changes made as directed, and signed approval by Medical Director.   new to prgram 30 Day review completed. Medical Director ITP review done, changes made as directed, and signed approval by Medical Director.    Row Name 08/08/23 1110 09/05/23 1117 10/03/23 1015       ITP Comments 30 Day review completed. Medical Director ITP review done, changes made as directed, and signed approval by Medical Director. 30 Day review completed. Medical Director ITP review done, changes  made as directed, and signed approval by Medical Director. 30 Day review completed. Medical Director ITP review done, changes made as directed, and signed approval by Medical Director.        Comments: 30 day review

## 2023-10-03 NOTE — Progress Notes (Signed)
 Daily Session Note  Patient Details  Name: Gerald Garza MRN: [id number] Date of Birth: 10/27/1960 Referring Provider:   Flowsheet Row Cardiac Rehab from 06/06/2023 in [facility] Cardiac and Pulmonary Rehab  Referring Provider Dr. Alm Clay    Encounter Date: 10/03/2023  Check In:  Session Check In - 10/03/23 0914       Check-In   Supervising physician immediately available to respond to emergencies See telemetry face sheet for immediately available ER MD    Location [facility]-Cardiac & Pulmonary Rehab    Staff Present Burnard Davenport RN,BSN,MPA;[facility] RCP,RRT,BSRT;Margaret Best, MS, Exercise Physiologist;Jason Elnor RDN,LDN;Noah Tickle, BS, Exercise Physiologist    Virtual Visit No    Medication changes reported     No    Fall or balance concerns reported    No    Tobacco Cessation No Change    Warm-up and Cool-down Performed on first and last piece of equipment    Resistance Training Performed Yes    VAD Patient? No    PAD/SET Patient? No      Pain Assessment   Currently in Pain? No/denies             Social History   Tobacco Use  Smoking Status Every Day   Current packs/day: 0.50   Average packs/day: 0.5 packs/day for 40.0 years (20.0 ttl pk-yrs)   Types: Cigarettes  Smokeless Tobacco Never    Goals Met:  Independence with exercise equipment Exercise tolerated well No report of concerns or symptoms today Strength training completed today  Goals Unmet:  Not Applicable  Comments: Pt able to follow exercise prescription today without complaint.  Will continue to monitor for progression.    Dr. Oneil Pinal is Medical Director for [facility] Cardiac Rehabilitation.  Dr. Fuad Aleskerov is Medical Director for [facility] Pulmonary Rehabilitation.

## 2023-10-11 ENCOUNTER — Encounter: Payer: Self-pay | Admitting: Nurse Practitioner

## 2023-10-11 ENCOUNTER — Ambulatory Visit: Attending: Nurse Practitioner | Admitting: Nurse Practitioner

## 2023-10-11 ENCOUNTER — Encounter: Attending: Cardiology | Admitting: *Deleted

## 2023-10-11 VITALS — BP 144/82 | HR 66 | Ht 72.0 in | Wt 225.4 lb

## 2023-10-11 DIAGNOSIS — I739 Peripheral vascular disease, unspecified: Secondary | ICD-10-CM | POA: Diagnosis not present

## 2023-10-11 DIAGNOSIS — Z720 Tobacco use: Secondary | ICD-10-CM | POA: Diagnosis not present

## 2023-10-11 DIAGNOSIS — I10 Essential (primary) hypertension: Secondary | ICD-10-CM

## 2023-10-11 DIAGNOSIS — F1010 Alcohol abuse, uncomplicated: Secondary | ICD-10-CM

## 2023-10-11 DIAGNOSIS — I2510 Atherosclerotic heart disease of native coronary artery without angina pectoris: Secondary | ICD-10-CM | POA: Diagnosis not present

## 2023-10-11 DIAGNOSIS — I214 Non-ST elevation (NSTEMI) myocardial infarction: Secondary | ICD-10-CM | POA: Diagnosis not present

## 2023-10-11 DIAGNOSIS — E785 Hyperlipidemia, unspecified: Secondary | ICD-10-CM

## 2023-10-11 DIAGNOSIS — Z955 Presence of coronary angioplasty implant and graft: Secondary | ICD-10-CM | POA: Diagnosis not present

## 2023-10-11 NOTE — Progress Notes (Signed)
 Office Visit    Patient Name: HAWK MONES Date of Encounter: 10/11/2023  Primary Care Provider:  Signa Rush, MD (Inactive) Primary Cardiologist:  Evalene Lunger, MD    Chief Complaint    63 y.o. male with a history of CAD status post non-STEMI and drug-eluting stent placement to the LAD in March 2025, hypertension, hyperlipidemia, tobacco abuse, and osteoarthritis, who presents for CAD follow-up.  Past Medical History   Subjective   Past Medical History:  Diagnosis Date   Arthritis    CAD (coronary artery disease)    a. 05/2023 NSTEMI-->Myoview : high risk w/ septal/apical infarct/ischemia; b. 05/2023 Cath/PCI: LM nl, LAD 126m/d (2.5x22 Xience Frontier DES x 2), D2 50ost, LCX min irregs, RCA 20/20p-->12 mos DAPT followed by SAPT rec.   Diastolic dysfunction    a. 05/2023 Echo: EF 55-60%, no rwma, GrI DD, nl RV fxn, RVSP 22.1 mmHg.   Headache    hx cluster headaches   Hyperlipidemia LDL goal <70    Hypertension    Pneumonia 2015   bacterial   Past Surgical History:  Procedure Laterality Date   bunion surgery Left    right hammer to done also   COLONOSCOPY WITH PROPOFOL  N/A 10/12/2015   Procedure: COLONOSCOPY WITH PROPOFOL ;  Surgeon: Gladis MARLA Louder, MD;  Location: [facility];  Service: Endoscopy;  Laterality: N/A;   CORONARY STENT INTERVENTION N/A 05/24/2023   Procedure: CORONARY STENT INTERVENTION;  Surgeon: Anner Alm ORN, MD;  Location: [facility] INVASIVE CV LAB;  Service: Cardiovascular;  Laterality: N/A;   LEFT HEART CATH AND CORONARY ANGIOGRAPHY N/A 05/24/2023   Procedure: LEFT HEART CATH AND CORONARY ANGIOGRAPHY;  Surgeon: Anner Alm ORN, MD;  Location: [facility] INVASIVE CV LAB;  Service: Cardiovascular;  Laterality: N/A;   REPLACEMENT TOTAL KNEE Bilateral    TONSILLECTOMY  age 67    Allergies  Allergies  Allergen Reactions   Codeine Nausea Only    Itching also  Other Reaction(s): nausea, itchy   Hydrocodone     Other Reaction(s): Not  available  hydrocodone       History of Present Illness      63 y.o. y/o male with a history of CAD, hypertension, hyperlipidemia, tobacco abuse, and osteoarthritis.. Mr. Staebell was admitted to [facility] on 05/23/23 with chest pain and ruled in for non-STEMI with mild troponin elevation to 41.  Stress test in the hospital showed septal and apical perfusion defect with reversible ischemia. Cardiac cath was completed on 05/2023, showing an occluded mid-LAD which was successfully treated with overlapping DES x 2.  Post-cath Echo showed and EF of 55-60%. He was d/c'd home on post-procedure day 1 with recommendation for DAPT (asa/prasugrel ) x 12 mos followed by thienopyridine monotherapy long term.     Mr. Redmann was last seen in cardiology clinic in April 2025, at which time he was doing well and had cut back on smoking to 4 to 5 cigarettes/day.  He was still drinking fairly heavily- a case of Miller light per week.  Follow-up lab work shows significant improvement in lipids with an LDL of 58 and triglycerides of 95 and normal LFTs.  He has since been participating in cardiac rehabilitation and tolerating well.  He does not experience chest pain or dyspnea.  He notes that sometimes when he is walking at a fast pace, he will have bilateral lower leg and calf claudication but that if he is using the elliptical, he does not have the symptoms despite working semivigorously for 20 or more  minutes.  He has not had any lower extremity discoloration, sensation, or change in hair distribution.  He denies palpitations, PND, orthopnea, dizziness, syncope, edema, or early satiety.  He continues to use cigarettes and drink beer as previously reported. Objective   Home Medications    Current Outpatient Medications  Medication Sig Dispense Refill   acetaminophen  (TYLENOL ) 500 MG tablet Take 500 mg by mouth every 6 (six) hours as needed.     amLODipine  (NORVASC ) 5 MG tablet Take 1 tablet (5 mg total) by mouth daily. 90  tablet 3   aspirin  EC 81 MG tablet Take 1 tablet (81 mg total) by mouth daily. Swallow whole. 30 tablet 12   fish oil-omega-3 fatty acids 1000 MG capsule Take 2 g by mouth daily.     lisinopril -hydrochlorothiazide  (PRINZIDE ,ZESTORETIC ) 20-25 MG tablet Take 1 tablet by mouth daily.     Magnesium 250 MG TABS Take by mouth 2 (two) times daily.     metoprolol  succinate (TOPROL -XL) 25 MG 24 hr tablet Take 0.5 tablets (12.5 mg total) by mouth daily. 30 tablet 3   Multiple Vitamin (MULTI-VITAMIN DAILY PO) Take by mouth.     naproxen sodium (ALEVE) 220 MG tablet Take 220 mg by mouth daily as needed.     prasugrel  (EFFIENT ) 10 MG TABS tablet Take 1 tablet (10 mg total) by mouth daily. 90 tablet 3   rosuvastatin  (CRESTOR ) 20 MG tablet Take 1 tablet (20 mg total) by mouth at bedtime. 90 tablet 3   vitamin B-12 (CYANOCOBALAMIN) 250 MCG tablet Take 250 mcg by mouth daily.     No current facility-administered medications for this visit.     Physical Exam    VS:  BP (!) 144/82   Pulse 66   Ht 6' (1.829 m)   Wt 225 lb 6 oz (102.2 kg)   SpO2 97%   BMI 30.57 kg/m  , BMI Body mass index is 30.57 kg/m.        Vitals:   10/11/23 1503 10/11/23 1614  BP: (!) 158/78 (!) 144/82  Pulse: 66   SpO2: 97%       GEN: Well nourished, well developed, in no acute distress. HEENT: normal. Neck: Supple, no JVD, carotid bruits, or masses. Cardiac: RRR, no murmurs, rubs, or gallops. No clubbing, cyanosis, edema.  Radials 2+/PT 2+ and equal bilaterally.  Respiratory:  Respirations regular and unlabored, clear to auscultation bilaterally. GI: Soft, nontender, nondistended, BS + x 4. MS: no deformity or atrophy. Skin: warm and dry, no rash. Neuro:  Strength and sensation are intact. Psych: Normal affect.  Accessory Clinical Findings    ECG personally reviewed by me today - EKG Interpretation Date/Time:  Thursday October 11 2023 15:13:52 EDT Ventricular Rate:  66 PR Interval:  180 QRS Duration:  80 QT  Interval:  404 QTC Calculation: 423 R Axis:   -19  Text Interpretation: Normal sinus rhythm Inferior infarct (cited on or before 25-May-2023) Confirmed by Vivienne Bruckner [phone]) on 10/11/2023 3:29:39 PM  - no acute changes.  Lab Results  Component Value Date   WBC 6.8 05/25/2023   HGB 14.9 05/25/2023   HCT 42.4 05/25/2023   MCV 94.0 05/25/2023   PLT 176 05/25/2023   Lab Results  Component Value Date   CREATININE 0.82 05/25/2023   BUN 19 05/25/2023   NA 140 05/25/2023   K 3.5 05/25/2023   CL 106 05/25/2023   CO2 24 05/25/2023   Lab Results  Component Value Date   ALT 38  07/02/2023   AST 26 07/02/2023   ALKPHOS 77 07/02/2023   BILITOT 0.4 07/02/2023   Lab Results  Component Value Date   CHOL 123 07/02/2023   HDL 47 07/02/2023   LDLCALC 58 07/02/2023   TRIG 95 07/02/2023   CHOLHDL 2.6 07/02/2023    Lab Results  Component Value Date   TSH 1.022 05/23/2023       Assessment & Plan    1.  Coronary artery disease: Status post non-STEMI in March 2025 with abnormal Myoview  leading to diagnostic catheterization revealing an occluded mid LAD, now status post overlapping drug-eluting stents x 2.  EF was 55 to 60% by echo postprocedure.  He has been doing well and tolerating cardiac rehabilitation without chest pain or dyspnea.  He remains on aspirin , lisinopril , metoprolol , Effient , and Crestor .  Plan to continue dual antiplatelet therapy for at least 12 months followed by thienopyridine monotherapy thereafter as previously outlined by interventional cardiology.  2.  Primary hypertension: Blood pressure elevated today on 2 consecutive recordings however, review of cardiac rehabilitation blood pressure shows that he trends much more normally while there.  He also follows his blood pressure at home and notes it is typically in the 120s.  In light of documented normal trends, I will hold off on adjusting medications.  He thinks that maybe it is elevated because he was drinking  coffee right up until his appointment today.  Continue to trend pressures at cardiac rehab at home.  He remains on lisinopril  HCTZ and metoprolol  succinate.  3.  Hyperlipidemia: Recent lipids returned with an LDL of 58 and normal LFTs.  He remains on rosuvastatin  therapy.  4.  Tobacco abuse: Still smoking 4 to 5 cigarettes a day.  He does have nicotine patches, which he thinks have helped, though during the summer months so far, patches have been falling off because his skin gets sweaty.  He is going to try applying patches in different locations.  Complete cessation advised.  5.  Alcohol abuse: He continues to drink a case of Miller light per week.  Reiterated the importance of cessation.  He recognizes this but at this time, is not contemplating quitting.  6.  Lower extremity claudication: Patient has noted since being more active in cardiac rehabilitation, that when he walks at a faster pace, that he experiences bilateral lower extremity claudication, left greater than right, involving his anterior lower legs and calves.  Symptoms resolved within a few minutes of rest.  Interestingly, he does not experience similar symptoms when using the elliptical for 20 or more minutes.  Posterior tibialis pulses are normal.  I will arrange for ABIs.  7.  Disposition: Follow-up ABIs.  Will tentatively plan for follow-up in 6 months or sooner pending ABI results.  Lonni Meager, NP 10/11/2023, 4:50 PM

## 2023-10-11 NOTE — Progress Notes (Signed)
 Daily Session Note  Patient Details  Name: Gerald Garza MRN: [id number] Date of Birth: 23-Aug-1960 Referring Provider:   Flowsheet Row Cardiac Rehab from 06/06/2023 in [facility] Cardiac and Pulmonary Rehab  Referring Provider Dr. Alm Clay    Encounter Date: 10/11/2023  Check In:  Session Check In - 10/11/23 1718       Check-In   Supervising physician immediately available to respond to emergencies See telemetry face sheet for immediately available ER MD    Location [facility]-Cardiac & Pulmonary Rehab    Staff Present [name] RN,BSN;[facility] BS, Exercise Physiologist    Virtual Visit No    Medication changes reported     No    Fall or balance concerns reported    No    Warm-up and Cool-down Performed on first and last piece of equipment    Resistance Training Performed Yes    VAD Patient? No    PAD/SET Patient? No      Pain Assessment   Currently in Pain? No/denies             Social History   Tobacco Use  Smoking Status Every Day   Current packs/day: 0.50   Average packs/day: 0.5 packs/day for 40.0 years (20.0 ttl pk-yrs)   Types: Cigarettes  Smokeless Tobacco Never  Tobacco Comments   Smokes 4 cigarettes daily     Goals Met:  Independence with exercise equipment Exercise tolerated well No report of concerns or symptoms today Strength training completed today  Goals Unmet:  Not Applicable  Comments: Pt able to follow exercise prescription today without complaint.  Will continue to monitor for progression.    Dr. Oneil Pinal is Medical Director for [facility] Cardiac Rehabilitation.  Dr. Fuad Aleskerov is Medical Director for [facility] Pulmonary Rehabilitation.

## 2023-10-11 NOTE — Patient Instructions (Signed)
 Medication Instructions:  Your physician recommends that you continue on your current medications as directed. Please refer to the Current Medication list given to you today.   *If you need a refill on your cardiac medications before your next appointment, please call your pharmacy*  Lab Work: None ordered at this time  If you have labs (blood work) drawn today and your tests are completely normal, you will receive your results only by: MyChart Message (if you have MyChart) OR A paper copy in the mail If you have any lab test that is abnormal or we need to change your treatment, we will call you to review the results.  Testing/Procedures: Your physician has requested that you have an ankle brachial index (ABI). During this test an ultrasound and blood pressure cuff are used to evaluate the arteries that supply the arms and legs with blood.  Allow thirty minutes for this exam.  There are no restrictions or special instructions.  This will take place at [address] [facility] [address], [place] [address]  Please note: We ask at that you not bring children with you during ultrasound (echo/ vascular) testing. Due to room size and safety concerns, children are not allowed in the ultrasound rooms during exams. Our front office staff cannot provide observation of children in our lobby area while testing is being conducted. An adult accompanying a patient to their appointment will only be allowed in the ultrasound room at the discretion of the ultrasound technician under special circumstances. We apologize for any inconvenience.   Follow-Up: At [facility], you and your health needs are our priority.  As part of our continuing mission to provide you with exceptional heart care, our providers are all part of one team.  This team includes your primary Cardiologist (physician) and Advanced Practice Providers or APPs (Physician Assistants and Nurse Practitioners) who all work  together to provide you with the care you need, when you need it.  Your next appointment:   6 month(s)  Provider:   You may see Timothy Gollan, MD   We recommend signing up for the patient portal called MyChart.  Sign up information is provided on this After Visit Summary.  MyChart is used to connect with patients for Virtual Visits (Telemedicine).  Patients are able to view lab/test results, encounter notes, upcoming appointments, etc.  Non-urgent messages can be sent to your provider as well.   To learn more about what you can do with MyChart, go to [email or web address].

## 2023-10-15 ENCOUNTER — Encounter: Admitting: *Deleted

## 2023-10-15 DIAGNOSIS — I214 Non-ST elevation (NSTEMI) myocardial infarction: Secondary | ICD-10-CM | POA: Diagnosis not present

## 2023-10-15 DIAGNOSIS — Z955 Presence of coronary angioplasty implant and graft: Secondary | ICD-10-CM

## 2023-10-15 NOTE — Progress Notes (Signed)
 Cardiac Individual Treatment Plan  Patient Details  Name: Gerald Garza MRN: [id number] Date of Birth: 1960/12/13 Referring Provider:   Flowsheet Row Cardiac Rehab from 06/06/2023 in [facility] Cardiac and Pulmonary Rehab  Referring Provider Dr. Alm Clay    Initial Encounter Date:  Flowsheet Row Cardiac Rehab from 06/06/2023 in [facility] Cardiac and Pulmonary Rehab  Date 06/06/23    Visit Diagnosis: NSTEMI (non-ST elevated myocardial infarction) [facility])  Status post coronary artery stent placement  Patient's Home Medications on Admission:  Current Outpatient Medications:    acetaminophen  (TYLENOL ) 500 MG tablet, Take 500 mg by mouth every 6 (six) hours as needed., Disp: , Rfl:    amLODipine  (NORVASC ) 5 MG tablet, Take 1 tablet (5 mg total) by mouth daily., Disp: 90 tablet, Rfl: 3   aspirin  EC 81 MG tablet, Take 1 tablet (81 mg total) by mouth daily. Swallow whole., Disp: 30 tablet, Rfl: 12   fish oil-omega-3 fatty acids 1000 MG capsule, Take 2 g by mouth daily., Disp: , Rfl:    lisinopril -hydrochlorothiazide  (PRINZIDE ,ZESTORETIC ) 20-25 MG tablet, Take 1 tablet by mouth daily., Disp: , Rfl:    Magnesium 250 MG TABS, Take by mouth 2 (two) times daily., Disp: , Rfl:    metoprolol  succinate (TOPROL -XL) 25 MG 24 hr tablet, Take 0.5 tablets (12.5 mg total) by mouth daily., Disp: 30 tablet, Rfl: 3   Multiple Vitamin (MULTI-VITAMIN DAILY PO), Take by mouth., Disp: , Rfl:    naproxen sodium (ALEVE) 220 MG tablet, Take 220 mg by mouth daily as needed., Disp: , Rfl:    prasugrel  (EFFIENT ) 10 MG TABS tablet, Take 1 tablet (10 mg total) by mouth daily., Disp: 90 tablet, Rfl: 3   rosuvastatin  (CRESTOR ) 20 MG tablet, Take 1 tablet (20 mg total) by mouth at bedtime., Disp: 90 tablet, Rfl: 3   vitamin B-12 (CYANOCOBALAMIN) 250 MCG tablet, Take 250 mcg by mouth daily., Disp: , Rfl:   Past Medical History: Past Medical History:  Diagnosis Date   Arthritis    CAD (coronary artery disease)    a. 05/2023  NSTEMI-->Myoview : high risk w/ septal/apical infarct/ischemia; b. 05/2023 Cath/PCI: LM nl, LAD 126m/d (2.5x22 Xience Frontier DES x 2), D2 50ost, LCX min irregs, RCA 20/20p-->12 mos DAPT followed by SAPT rec.   Diastolic dysfunction    a. 05/2023 Echo: EF 55-60%, no rwma, GrI DD, nl RV fxn, RVSP 22.1 mmHg.   Headache    hx cluster headaches   Hyperlipidemia LDL goal <70    Hypertension    Pneumonia 2015   bacterial    Tobacco Use: Social History   Tobacco Use  Smoking Status Every Day   Current packs/day: 0.50   Average packs/day: 0.5 packs/day for 40.0 years (20.0 ttl pk-yrs)   Types: Cigarettes  Smokeless Tobacco Never  Tobacco Comments   Smokes 4 cigarettes daily     Labs: Review Flowsheet       Latest Ref Rng & Units 05/23/2023 07/02/2023  Labs for ITP Cardiac and Pulmonary Rehab  Cholestrol 100 - 199 mg/dL 799  876   LDL (calc) 0 - 99 mg/dL 85  58   HDL-C >60 mg/dL 41  47   Trlycerides 0 - 149 mg/dL 631  95      Exercise Target Goals: Exercise Program Goal: Individual exercise prescription set using results from initial 6 min walk test and THRR while considering  patient's activity barriers and safety.   Exercise Prescription Goal: Initial exercise prescription builds to 30-45 minutes a day  of aerobic activity, 2-3 days per week.  Home exercise guidelines will be given to patient during program as part of exercise prescription that the participant will acknowledge.   Education: Aerobic Exercise: - Group verbal and visual presentation on the components of exercise prescription. Introduces F.I.T.T principle from ACSM for exercise prescriptions.  Reviews F.I.T.T. principles of aerobic exercise including progression. Written material provided at class time.   Education: Resistance Exercise: - Group verbal and visual presentation on the components of exercise prescription. Introduces F.I.T.T principle from ACSM for exercise prescriptions  Reviews F.I.T.T. principles of  resistance exercise including progression. Written material provided at class time.    Education: Exercise & Equipment Safety: - Individual verbal instruction and demonstration of equipment use and safety with use of the equipment. Flowsheet Row Cardiac Rehab from 06/06/2023 in [facility] Cardiac and Pulmonary Rehab  Date 06/06/23  Educator [facility]  Instruction Review Code 1- Verbalizes Understanding    Education: Exercise Physiology & General Exercise Guidelines: - Group verbal and written instruction with models to review the exercise physiology of the cardiovascular system and associated critical values. Provides general exercise guidelines with specific guidelines to those with heart or lung disease. Written material provided at class time.   Education: Flexibility, Balance, Mind/Body Relaxation: - Group verbal and visual presentation with interactive activity on the components of exercise prescription. Introduces F.I.T.T principle from ACSM for exercise prescriptions. Reviews F.I.T.T. principles of flexibility and balance exercise training including progression. Also discusses the mind body connection.  Reviews various relaxation techniques to help reduce and manage stress (i.e. Deep breathing, progressive muscle relaxation, and visualization). Balance handout provided to take home. Written material provided at class time.   Activity Barriers & Risk Stratification:  Activity Barriers & Cardiac Risk Stratification - 06/06/23 1205       Activity Barriers & Cardiac Risk Stratification   Activity Barriers Left Knee Replacement;Right Knee Replacement;Other (comment)    Comments Left shoulder replacement. No issues with shoulder replacement. No pain with knee replacements but does have some limitied ROM    Cardiac Risk Stratification Moderate          6 Minute Walk:  6 Minute Walk     Row Name 06/06/23 1202 09/17/23 1727       6 Minute Walk   Phase Initial Discharge    Distance 1160 feet  1400 feet    Distance % Change -- 21 %    Distance Feet Change -- 240 ft    Walk Time 6 minutes 6 minutes    # of Rest Breaks 0 0    MPH 2.2 2.65    METS 2.76 3.37    RPE 7 11    Perceived Dyspnea  0 0    VO2 Peak 9.66 11.81    Symptoms No No    Resting HR 73 bpm 75 bpm    Resting BP 130/80 140/78    Resting Oxygen Saturation  96 % 95 %    Exercise Oxygen Saturation  during 6 min walk 96 % 96 %    Max Ex. HR 89 bpm 96 bpm    Max Ex. BP 150/80 160/76    2 Minute Post BP 124/80 --       Oxygen Initial Assessment:   Oxygen Re-Evaluation:   Oxygen Discharge (Final Oxygen Re-Evaluation):   Initial Exercise Prescription:  Initial Exercise Prescription - 06/06/23 1200       Date of Initial Exercise RX and Referring Provider   Date 06/06/23  Referring Provider Dr. Alm Clay      Oxygen   Maintain Oxygen Saturation 88% or higher      Treadmill   MPH 2    Grade 1    Minutes 15    METs 2.81      REL-XR   Level 2    Speed 50    Minutes 15    METs 2.76      T5 Nustep   Level 2    SPM 80    Minutes 15    METs 2.76      Prescription Details   Frequency (times per week) 3    Duration Progress to 30 minutes of continuous aerobic without signs/symptoms of physical distress      Intensity   THRR 40-80% of Max Heartrate 105-138    Ratings of Perceived Exertion 11-13    Perceived Dyspnea 0-4      Progression   Progression Continue to progress workloads to maintain intensity without signs/symptoms of physical distress.      Resistance Training   Training Prescription Yes    Weight 5    Reps 10-15          Perform Capillary Blood Glucose checks as needed.  Exercise Prescription Changes:   Exercise Prescription Changes     Row Name 06/06/23 1200 06/28/23 1700 07/12/23 1300 07/25/23 1700 07/26/23 1800     Response to Exercise   Blood Pressure (Admit) 130/80 122/76 118/70 -- 122/70   Blood Pressure (Exercise) 150/80 162/86 164/68 -- --   Blood  Pressure (Exit) 124/80 118/68 118/64 -- 124/62   Heart Rate (Admit) 73 bpm 81 bpm 89 bpm -- 81 bpm   Heart Rate (Exercise) 89 bpm 118 bpm 112 bpm -- 108 bpm   Heart Rate (Exit) 75 bpm 97 bpm 84 bpm -- 73 bpm   Oxygen Saturation (Admit) 96 % -- -- -- --   Oxygen Saturation (Exercise) 96 % -- -- -- --   Oxygen Saturation (Exit) 97 % -- -- -- --   Rating of Perceived Exertion (Exercise) 7 13 15  -- 13   Perceived Dyspnea (Exercise) 0 -- -- -- --   Symptoms none none none -- none   Comments 6 MWT results First 2 weeks of exercise sessions -- -- --   Duration -- Continue with 30 min of aerobic exercise without signs/symptoms of physical distress. Continue with 30 min of aerobic exercise without signs/symptoms of physical distress. Continue with 30 min of aerobic exercise without signs/symptoms of physical distress. Continue with 30 min of aerobic exercise without signs/symptoms of physical distress.   Intensity -- THRR unchanged THRR unchanged THRR unchanged THRR unchanged     Progression   Progression -- Continue to progress workloads to maintain intensity without signs/symptoms of physical distress. Continue to progress workloads to maintain intensity without signs/symptoms of physical distress. Continue to progress workloads to maintain intensity without signs/symptoms of physical distress. Continue to progress workloads to maintain intensity without signs/symptoms of physical distress.   Average METs -- 2.7 3.74 3.74 2.96     Resistance Training   Training Prescription -- Yes Yes Yes Yes   Weight -- 5 5 5 5    Reps -- 10-15 10-15 10-15 10-15     Interval Training   Interval Training -- No No No No     Treadmill   MPH -- 2 -- -- --   Grade -- 1 -- -- --   Minutes -- 15 -- -- --  METs -- 2.81 -- -- --     NuStep   Level -- -- 4 4 5    Minutes -- -- 15 15 15    METs -- -- 5.8 5.8 5     Arm Ergometer   Level -- -- 1.5 1.5 2   Minutes -- -- 15 15 15    METs -- -- 1 1 1      REL-XR    Level -- 2 4 4 7    Minutes -- 14 15 15 15    METs -- 3.5 4.9 4.9 5.6     T5 Nustep   Level -- 3 3 3 3    Minutes -- 15 15 15 15    METs -- 2.3 3.6 3.6 3.1     Biostep-RELP   Level -- 4 -- -- --   SPM -- 50 -- -- --   Minutes -- 15 -- -- --   METs -- 3 -- -- --     Home Exercise Plan   Plans to continue exercise at -- -- -- [employer] (comment)  plans to join [employer] (comment)  plans to join [employer] -- -- -- Add 2 additional days to program exercise sessions. Add 2 additional days to program exercise sessions.   Initial Home Exercises Provided -- -- -- 07/25/23 07/25/23     Oxygen   Maintain Oxygen Saturation -- 88% or higher 88% or higher 88% or higher 88% or higher    Row Name 08/08/23 1500 08/23/23 0900 09/05/23 0700 09/18/23 1500 10/03/23 1100     Response to Exercise   Blood Pressure (Admit) 124/60 142/82 130/82 132/80 136/80   Blood Pressure (Exit) 126/72 124/72 108/64 122/82 102/60   Heart Rate (Admit) 84 bpm 76 bpm 79 bpm 79 bpm 84 bpm   Heart Rate (Exercise) 112 bpm 106 bpm 98 bpm 100 bpm 94 bpm   Heart Rate (Exit) 112 bpm 85 bpm 84 bpm 80 bpm 85 bpm   Oxygen Saturation (Admit) 94 % -- -- 97 % 94 %   Oxygen Saturation (Exercise) 95 % -- -- 92 % 91 %   Oxygen Saturation (Exit) 97 % -- -- 96 % 95 %   Rating of Perceived Exertion (Exercise) 15 13 12 13 13    Symptoms none none none none none   Duration Continue with 30 min of aerobic exercise without signs/symptoms of physical distress. Continue with 30 min of aerobic exercise without signs/symptoms of physical distress. Continue with 30 min of aerobic exercise without signs/symptoms of physical distress. Continue with 30 min of aerobic exercise without signs/symptoms of physical distress. Continue with 30 min of aerobic exercise without signs/symptoms of physical distress.   Intensity THRR unchanged THRR unchanged THRR unchanged THRR unchanged THRR unchanged     Progression   Progression  Continue to progress workloads to maintain intensity without signs/symptoms of physical distress. Continue to progress workloads to maintain intensity without signs/symptoms of physical distress. Continue to progress workloads to maintain intensity without signs/symptoms of physical distress. Continue to progress workloads to maintain intensity without signs/symptoms of physical distress. Continue to progress workloads to maintain intensity without signs/symptoms of physical distress.   Average METs 4.65 2.15 3.35 2.98 2.8     Resistance Training   Training Prescription Yes Yes Yes Yes Yes   Weight 5 7 lb 7 lb 7 lb 7 lb   Reps 10-15 10-15 10-15 10-15 10-15     Interval Training   Interval Training No No No No  No     NuStep   Level 5 -- 5 5 5    Minutes 15 -- 15 15 15    METs 5.7 -- 5.3 5.2 5.2     Arm Ergometer   Level 2 2 1 2 2    Minutes 15 15 15 15 15    METs -- 1 3 1 1      Elliptical   Level -- 1 -- -- --   Speed -- 0 -- -- --   Minutes -- 15 -- -- --     REL-XR   Level -- 3 -- -- --   Minutes -- 15 -- -- --   METs -- 3.6 -- -- --     T5 Nustep   Level 4 4 4 4  --   Minutes 15 15 15 15  --   METs -- 4.4 3 3  --     Home Exercise Plan   Plans to continue exercise at [employer] (comment)  plans to join [employer] (comment)  plans to join [employer] (comment)  plans to join [employer] (comment)  plans to join [employer] (comment)  plans to join [employer] Add 2 additional days to program exercise sessions. Add 2 additional days to program exercise sessions. Add 2 additional days to program exercise sessions. Add 2 additional days to program exercise sessions. Add 2 additional days to program exercise sessions.   Initial Home Exercises Provided 07/25/23 07/25/23 07/25/23 07/25/23 07/25/23     Oxygen   Maintain Oxygen Saturation 88% or higher 88% or higher 88% or higher 88% or higher 88% or higher      Exercise  Comments:   Exercise Comments     Row Name 06/11/23 1733           Exercise Comments First full day of exercise!  Patient was oriented to gym and equipment including functions, settings, policies, and procedures.  Patient's individual exercise prescription and treatment plan were reviewed.  All starting workloads were established based on the results of the 6 minute walk test done at initial orientation visit.  The plan for exercise progression was also introduced and progression will be customized based on patient's performance and goals.          Exercise Goals and Review:   Exercise Goals     Row Name 06/06/23 1210             Exercise Goals   Increase Physical Activity Yes       Intervention Provide advice, education, support and counseling about physical activity/exercise needs.;Develop an individualized exercise prescription for aerobic and resistive training based on initial evaluation findings, risk stratification, comorbidities and participant's personal goals.       Expected Outcomes Short Term: Attend rehab on a regular basis to increase amount of physical activity.;Long Term: Add in home exercise to make exercise part of routine and to increase amount of physical activity.;Long Term: Exercising regularly at least 3-5 days a week.       Increase Strength and Stamina Yes       Intervention Provide advice, education, support and counseling about physical activity/exercise needs.;Develop an individualized exercise prescription for aerobic and resistive training based on initial evaluation findings, risk stratification, comorbidities and participant's personal goals.       Expected Outcomes Short Term: Increase workloads from initial exercise prescription for resistance, speed, and METs.;Short Term: Perform resistance training exercises routinely during rehab and add in resistance training at home;Long  Term: Improve cardiorespiratory fitness, muscular endurance and strength as  measured by increased METs and functional capacity ( )       Able to understand and use rate of perceived exertion (RPE) scale Yes       Intervention Provide education and explanation on how to use RPE scale       Expected Outcomes Short Term: Able to use RPE daily in rehab to express subjective intensity level;Long Term:  Able to use RPE to guide intensity level when exercising independently       Able to understand and use Dyspnea scale Yes       Intervention Provide education and explanation on how to use Dyspnea scale       Expected Outcomes Short Term: Able to use Dyspnea scale daily in rehab to express subjective sense of shortness of breath during exertion;Long Term: Able to use Dyspnea scale to guide intensity level when exercising independently       Knowledge and understanding of Target Heart Rate Range (THRR) Yes       Intervention Provide education and explanation of THRR including how the numbers were predicted and where they are located for reference       Expected Outcomes Short Term: Able to state/look up THRR;Long Term: Able to use THRR to govern intensity when exercising independently;Short Term: Able to use daily as guideline for intensity in rehab       Able to check pulse independently Yes       Intervention Provide education and demonstration on how to check pulse in carotid and radial arteries.;Review the importance of being able to check your own pulse for safety during independent exercise       Expected Outcomes Short Term: Able to explain why pulse checking is important during independent exercise;Long Term: Able to check pulse independently and accurately       Understanding of Exercise Prescription Yes       Intervention Provide education, explanation, and written materials on patient's individual exercise prescription       Expected Outcomes Short Term: Able to explain program exercise prescription;Long Term: Able to explain home exercise prescription to exercise  independently          Exercise Goals Re-Evaluation :  Exercise Goals Re-Evaluation     Row Name 06/11/23 1733 06/28/23 1751 07/12/23 1356 07/25/23 1744 07/26/23 1810     Exercise Goal Re-Evaluation   Exercise Goals Review Increase Physical Activity;Able to understand and use rate of perceived exertion (RPE) scale;Knowledge and understanding of Target Heart Rate Range (THRR);Understanding of Exercise Prescription;Increase Strength and Stamina;Able to check pulse independently Increase Physical Activity;Understanding of Exercise Prescription;Increase Strength and Stamina Increase Physical Activity;Understanding of Exercise Prescription;Increase Strength and Stamina Increase Physical Activity;Increase Strength and Stamina;Able to understand and use rate of perceived exertion (RPE) scale;Able to understand and use Dyspnea scale;Knowledge and understanding of Target Heart Rate Range (THRR);Able to check pulse independently;Understanding of Exercise Prescription Increase Physical Activity;Understanding of Exercise Prescription;Increase Strength and Stamina   Comments Reviewed RPE and dyspnea scale, THR and program prescription with pt today.  Pt voiced understanding and was given a copy of goals to take home. Joe is off to a good start in the program and finished his first 2 weeks in this review. He did the treadmill at a speed of 2. with 1% incline. He did level 2 on the XR and level 3 on the T5 nustep. He also added the biostep to his prescription at level 4. We will continue  to monitor his progress in the program. Larnell is doing well in rehab. He recently able to increase from level 2 to 4 on the XR. He was also able to maintain level 4 on the T4 nustep. We will continue to monitor his progress in the program. Reviewed home exercise with pt today.  Pt plans to join the [facility]  for exercise.  Reviewed THR, pulse, RPE, sign and symptoms, pulse oximetery and when to call 911 or MD.  Also discussed weather  considerations and indoor options.  Pt voiced understanding. Joe continues to do well in rehab. He increased to level 5 on the T4 nustep, level 7 on the XR, and level 2 on the arm ergometer. We will continue to monitor his progress in the program.   Expected Outcomes Short: Use RPE daily to regulate intensity.  Long: Follow program prescription in THR. Short: Continue to follow current exercise prescription. Long: Continue exercise to improve strength and stamina. Short: Continue to follow current exercise prescription. Long: Continue exercise to improve strength and stamina. Short: add 1-2 days a week of exercise at home on off days of rehab. Join [employer]. Long: maintain independent exercise routine. Short: Continue to progressively increase nustep, XR, and arm ergometer workloads. Long: Continue exercise to improve strength and stamina.    Row Name 08/08/23 1536 08/13/23 1023 08/23/23 0907 09/05/23 0800 09/18/23 1528     Exercise Goal Re-Evaluation   Exercise Goals Review Increase Physical Activity;Understanding of Exercise Prescription;Increase Strength and Stamina Increase Physical Activity;Understanding of Exercise Prescription Increase Physical Activity;Understanding of Exercise Prescription;Increase Strength and Stamina Increase Physical Activity;Understanding of Exercise Prescription;Increase Strength and Stamina Increase Physical Activity;Understanding of Exercise Prescription;Increase Strength and Stamina   Comments Larnell is doing well in rehab. He was recently able to increase from level 3 to 4 on the T5 nustep. He has been able to maintain level 4 on the T5 nustep and level 2 on the Arm ergometer. We will continue to monitor his progress in the program. Larnell is planning on joining the [facility] when he is closer to graduating. He says he lives 2 minutes away and thinks he can manage a consistent exercise routine with his work schedule. He wants to start walking more on his off days from the program Larnell is  doing well in rehab. He continues to work at level 2 on the arm crank and level 4 on the T5 nustep. He also increased to 7 lb hand weights for resistance training. We will continue to monitor his progress in the progran. Joe continues to do well in rehab. He continues to work at level 5 on the T4 nustep and level 4 on the T5 nustep. He is also due for his post and will look to improve on it. We will continue to monitor his progress in the progran. Larnell is doing well in rehab. He was able to maintain his workload on both the T4 nustep and T5 nustep, at level 5 and 4. He was also able to use the arm ergometer at level 2. We will continue to monitor his progress in the program.   Expected Outcomes Short: Continue to increase T4 nustep workloads. Long: Continue exercise to improve strength and stamina. Short: walk on his off days from the program and look [facility] [employer]. Long: maintain an exercise routine. Short: Continue to progressively increase workloads. Long: Continue exercise to improve strength and stamina. Short: Improve on post . Long: Continue exercise to improve strength and stamina.  Short: Improve on post . Long: Continue exercise to improve strength and stamina.    Row Name 10/01/23 1741 10/03/23 1130           Exercise Goal Re-Evaluation   Exercise Goals Review Increase Physical Activity;Understanding of Exercise Prescription;Increase Strength and Stamina Increase Physical Activity;Understanding of Exercise Prescription;Increase Strength and Stamina      Comments Joe going to workout at [facility] after [facility]. Joe continues to do well in rehab. He recently completed his post-6MWT and increased by 21%. He was also able to use the Arm ergometer at level 2 and the T4 nustep at level 5. We will continue to monitor his progress in the program.      Expected Outcomes Short: workout at the [facility]. Long: maintain exercise independently Short: Graduate. Long: Continue  exercise to improve strength and stamina.         Discharge Exercise Prescription (Final Exercise Prescription Changes):  Exercise Prescription Changes - 10/03/23 1100       Response to Exercise   Blood Pressure (Admit) 136/80    Blood Pressure (Exit) 102/60    Heart Rate (Admit) 84 bpm    Heart Rate (Exercise) 94 bpm    Heart Rate (Exit) 85 bpm    Oxygen Saturation (Admit) 94 %    Oxygen Saturation (Exercise) 91 %    Oxygen Saturation (Exit) 95 %    Rating of Perceived Exertion (Exercise) 13    Symptoms none    Duration Continue with 30 min of aerobic exercise without signs/symptoms of physical distress.    Intensity THRR unchanged      Progression   Progression Continue to progress workloads to maintain intensity without signs/symptoms of physical distress.    Average METs 2.8      Resistance Training   Training Prescription Yes    Weight 7 lb    Reps 10-15      Interval Training   Interval Training No      NuStep   Level 5    Minutes 15    METs 5.2      Arm Ergometer   Level 2    Minutes 15    METs 1      Home Exercise Plan   Plans to continue exercise at [employer] (comment)   plans to join [employer]   Frequency Add 2 additional days to program exercise sessions.    Initial Home Exercises Provided 07/25/23      Oxygen   Maintain Oxygen Saturation 88% or higher          Nutrition:  Target Goals: Understanding of nutrition guidelines, daily intake of sodium 1500mg , cholesterol 200mg , calories 30% from fat and 7% or less from saturated fats, daily to have 5 or more servings of fruits and vegetables.  Education: Nutrition 1 -Group instruction provided by verbal, written material, interactive activities, discussions, models, and posters to present general guidelines for heart healthy nutrition including macronutrients, label reading, and promoting whole foods over processed counterparts. Education serves as Pensions consultant of discussion of heart healthy  eating for all. Written material provided at class time.    Education: Nutrition 2 -Group instruction provided by verbal, written material, interactive activities, discussions, models, and posters to present general guidelines for heart healthy nutrition including sodium, cholesterol, and saturated fat. Providing guidance of habit forming to improve blood pressure, cholesterol, and body weight. Written material provided at class time.     Biometrics:  Pre Biometrics - 06/06/23 1211  Pre Biometrics   Height 5' 11.5 (1.816 m)    Weight 228 lb 1.6 oz (103.5 kg)    Waist Circumference 44 inches    Hip Circumference 44.5 inches    Waist to Hip Ratio 0.99 %    BMI (Calculated) 31.37    Single Leg Stand 15.68 seconds          Post Biometrics - 09/17/23 1730        Post  Biometrics   Height 5' 11.5 (1.816 m)    Weight 223 lb (101.2 kg)    Waist Circumference 44 inches    Hip Circumference 44.5 inches    Waist to Hip Ratio 0.99 %    BMI (Calculated) 30.67    Single Leg Stand 30 seconds          Nutrition Therapy Plan and Nutrition Goals:  Nutrition Therapy & Goals - 06/18/23 1732       Nutrition Therapy   Diet Cardiac, Low Na    Protein (specify units) 80-100    Fiber 30 grams    Whole Grain Foods 3 servings    Saturated Fats 15 max. grams    Fruits and Vegetables 5 servings/day    Sodium 2 grams      Personal Nutrition Goals   Nutrition Goal Reduce saturated fat, less than 12g per day. Replace bad fats for more heart healthy fats.    Personal Goal #2 Read labels and reduce sodium intake to below 2300mg . Ideally 1500mg  per day.    Personal Goal #3 Eat 15-30gProtein and 30-60gCarbs at each meal.    Comments Patient drinking lots of water when at work. But when he is home, he drinks lots of milk. Says he has been going through 2 gallons per week. Drinks some lite beers as well. Spoke with him about reducing fat content of milk, he says he used to drink whole  milk but has cut down to 2%. Commended him on this change and encouraged him to continue to reduce fat percentage or reduce how much he is drinking. He will try to drink 1 gallon per week. Educated on identifying nutrients of concern like saturated fat and sodium. Provided guideline limits to sodium and saturated fat, 1500mg  and 15gSat fat respectively. Talked with him about his diet, sodium will need to be monitored and encouraged him to read labels to prevent getting surprised my foods that have way too much sodium. He also likes a lot of saturated rich foods. He may need to work on decreasing his saturated fat intake more. Encouraged and supported him on this journey to make better changes. Reviewed mediterranean diet handout. Educated on types of fats, sources, and how to read labels. Together, looked at several facts labels of foods he likes. Walked though label evaluations with aim to better his understanding and confidence in reading labels independently. Brainstormed several meals and snacks with foods he likes and will eat.      Intervention Plan   Intervention Prescribe, educate and counsel regarding individualized specific dietary modifications aiming towards targeted core components such as weight, hypertension, lipid management, diabetes, heart failure and other comorbidities.;Nutrition handout(s) given to patient.    Expected Outcomes Short Term Goal: Understand basic principles of dietary content, such as calories, fat, sodium, cholesterol and nutrients.;Short Term Goal: A plan has been developed with personal nutrition goals set during dietitian appointment.;Long Term Goal: Adherence to prescribed nutrition plan.          Nutrition Assessments:  MEDIFICTS  Score Key: >=70 Need to make dietary changes  40-70 Heart Healthy Diet <= 40 Therapeutic Level Cholesterol Diet  Flowsheet Row Cardiac Rehab from 09/19/2023 in [facility] Cardiac [facility] Pul[facility]  Picture Your Plate Total Score on  Admission 50  Picture Your Plate Total Score on Discharge 66   Picture Your Plate Scores: <59 Unhealthy dietary pattern with much room for improvement. 41-50 Dietary pattern unlikely to meet recommendations for good health and room for improvement. 51-60 More healthful dietary pattern, with some room for improvement.  >60 Healthy dietary pattern, although there may be some specific behaviors that could be improved.    Nutrition Goals Re-Evaluation:  Nutrition Goals Re-Evaluation     Row Name 07/25/23 1752 08/13/23 1021 10/01/23 1737         Goals   Nutrition Goal Reduce saturated fat, less than 12g per day. Replace bad fats for more heart healthy fats. Reduce saturated fat, less than 12g per day. Replace bad fats for more heart healthy fats. --     Comment Patient reports that he is reading food labels and working on reducing sodium and saturated fats in his diet. He does include lean protein in diet and is trying to make nutritional changes that have helped in his weight loss goal. Larnell reports his wife and he have been watching food labels and cooking healthier foods.They are choosing foods with lower saturated fats and improving their protein intake. Patient states that he is making healthy choices with his food and is eating alot vegetables.     Expected Outcome Short: continue to work towards weight loss goal of 210 lbs. Long: maintain heart healthy diet. Short: continue to choose heart healthy foods. Long: independently manage heart healthy diet Short: maintain eating a heart healthy diet. Long: watchs labels and sodium intake independently.       Personal Goal #2 Re-Evaluation   Personal Goal #2 Read labels and reduce sodium intake to below 2300mg . Ideally 1500mg  per day. -- --       Personal Goal #3 Re-Evaluation   Personal Goal #3 Eat 15-30gProtein and 30-60gCarbs at each meal. -- --        Nutrition Goals Discharge (Final Nutrition Goals Re-Evaluation):  Nutrition Goals  Re-Evaluation - 10/01/23 1737       Goals   Comment Patient states that he is making healthy choices with his food and is eating alot vegetables.    Expected Outcome Short: maintain eating a heart healthy diet. Long: watchs labels and sodium intake independently.          Psychosocial: Target Goals: Acknowledge presence or absence of significant depression and/or stress, maximize coping skills, provide positive support system. Participant is able to verbalize types and ability to use techniques and skills needed for reducing stress and depression.   Education: Stress, Anxiety, and Depression - Group verbal and visual presentation to define topics covered.  Reviews how body is impacted by stress, anxiety, and depression.  Also discusses healthy ways to reduce stress and to treat/manage anxiety and depression. Written material provided at class time.   Education: Sleep Hygiene -Provides group verbal and written instruction about how sleep can affect your health.  Define sleep hygiene, discuss sleep cycles and impact of sleep habits. Review good sleep hygiene tips.   Initial Review & Psychosocial Screening:  Initial Psych Review & Screening - 05/30/23 1346       Initial Review   Current issues with None Identified      Family  Dynamics   Good Support System? Yes    Comments Larnell has his wife and 4 dogs to look to for support. He does not take anything for his mood and states no mental instability.      Barriers   Psychosocial barriers to participate in program There are no identifiable barriers or psychosocial needs.;The patient should benefit from training in stress management and relaxation.      Screening Interventions   Interventions Encouraged to exercise;To provide support and resources with identified psychosocial needs;Provide feedback about the scores to participant    Expected Outcomes Short Term goal: Utilizing psychosocial counselor, staff and physician to assist with  identification of specific Stressors or current issues interfering with healing process. Setting desired goal for each stressor or current issue identified.;Long Term Goal: Stressors or current issues are controlled or eliminated.;Short Term goal: Identification and review with participant of any Quality of Life or Depression concerns found by scoring the questionnaire.;Long Term goal: The participant improves quality of Life and PHQ9 Scores as seen by post scores and/or verbalization of changes          Quality of Life Scores:   Quality of Life - 09/19/23 1728       Quality of Life Scores   Health/Function Pre 26.57 %    Health/Function Post 25.4 %    Health/Function % Change -4.4 %    Socioeconomic Pre 29.64 %    Socioeconomic Post 27.81 %    Socioeconomic % Change  -6.17 %    Psych/Spiritual Pre 30 %    Psych/Spiritual Post 30 %    Psych/Spiritual % Change 0 %    Family Pre 30 %    Family Post 27.1 %    Family % Change -9.67 %    GLOBAL Pre 28.37 %    GLOBAL Post 27.11 %    GLOBAL % Change -4.44 %         Scores of 19 and below usually indicate a poorer quality of life in these areas.  A difference of  2-3 points is a clinically meaningful difference.  A difference of 2-3 points in the total score of the Quality of Life Index has been associated with significant improvement in overall quality of life, self-image, physical symptoms, and general health in studies assessing change in quality of life.  PHQ-9: Review Flowsheet       09/19/2023 06/06/2023  Depression screen PHQ 2/9  Decreased Interest 0 0  Down, Depressed, Hopeless 0 0  PHQ - 2 Score 0 0  Altered sleeping 1 0  Tired, decreased energy 2 1  Change in appetite 0 0  Feeling bad or failure about yourself  0 0  Trouble concentrating 0 0  Moving slowly or fidgety/restless 0 0  Suicidal thoughts 0 0  PHQ-9 Score 3 1  Difficult doing work/chores Not difficult at all Not difficult at all   Interpretation of Total  Score  Total Score Depression Severity:  1-4 = Minimal depression, 5-9 = Mild depression, 10-14 = Moderate depression, 15-19 = Moderately severe depression, 20-27 = Severe depression   Psychosocial Evaluation and Intervention:  Psychosocial Evaluation - 05/30/23 1348       Psychosocial Evaluation & Interventions   Interventions Encouraged to exercise with the program and follow exercise prescription;Relaxation education;Stress management education    Comments Larnell has his wife and 4 dogs to look to for support. He does not take anything for his mood and states no mental instability.  Expected Outcomes Short: Start [facility] to help with mood. Long: Maintain a healthy mental state    Continue Psychosocial Services  Follow up required by staff          Psychosocial Re-Evaluation:  Psychosocial Re-Evaluation     Row Name 07/25/23 1754 08/13/23 1014 10/01/23 1735         Psychosocial Re-Evaluation   Current issues with None Identified None Identified None Identified     Comments Patient reports that he has no new concerns with sleep, stress, or mental health. He continues to have a good support system. Joe reports no stress or sleep concerns at this time. He is enjoying his retirement working a job he loves and states it isn't stressful. His wife and he have been working together to make healthy improvements. [facility] is [facility] [employer] [facility] Joe states no issues with is mood and has a positive outlook on his health.     Expected Outcomes Short: continue to consistently exercise in cardiac rehab for mental health benefits. Long: maintain good mental health and sleep routine. Short: continue to attend cardiac rehab for exercise and education. Long: independenlty manage positive habits Short: [facility]. Long: maintain a healthy mental state     Interventions Encouraged to attend Cardiac Rehabilitation for the exercise Encouraged to attend Cardiac  Rehabilitation for the exercise Encouraged to attend Cardiac Rehabilitation for the exercise     Continue Psychosocial Services  Follow up required by staff Follow up required by staff Follow up required by staff        Psychosocial Discharge (Final Psychosocial Re-Evaluation):  Psychosocial Re-Evaluation - 10/01/23 1735       Psychosocial Re-Evaluation   Current issues with None Identified    Comments Joe states no issues with is mood and has a positive outlook on his health.    Expected Outcomes Short: [facility]. Long: maintain a healthy mental state    Interventions Encouraged to attend Cardiac Rehabilitation for the exercise    Continue Psychosocial Services  Follow up required by staff          Vocational Rehabilitation: Provide vocational rehab assistance to qualifying candidates.   Vocational Rehab Evaluation & Intervention:   Education: Education Goals: Education classes will be provided on a variety of topics geared toward better understanding of heart health and risk factor modification. Participant will state understanding/return demonstration of topics presented as noted by education test scores.  Learning Barriers/Preferences:  Learning Barriers/Preferences - 05/30/23 1344       Learning Barriers/Preferences   Learning Barriers None    Learning Preferences None          General Cardiac Education Topics:  AED/CPR: - Group verbal and written instruction with the use of models to demonstrate the basic use of the AED with the basic ABC's of resuscitation.   Test and Procedures: - Group verbal and visual presentation and models provide information about basic cardiac anatomy and function. Reviews the testing methods done to diagnose heart disease and the outcomes of the test results. Describes the treatment choices: Medical Management, Angioplasty, or Coronary Bypass Surgery for treating various heart conditions including Myocardial Infarction, Angina,  Valve Disease, and Cardiac Arrhythmias. Written material provided at class time.   Medication Safety: - Group verbal and visual instruction to review commonly prescribed medications for heart and lung disease. Reviews the medication, class of the drug, and side effects. Includes the steps to properly store meds and maintain the prescription  regimen. Written material provided at class time.   Intimacy: - Group verbal instruction through game format to discuss how heart and lung disease can affect sexual intimacy. Written material provided at class time.   Know Your Numbers and Heart Failure: - Group verbal and visual instruction to discuss disease risk factors for cardiac and pulmonary disease and treatment options.  Reviews associated critical values for Overweight/Obesity, Hypertension, Cholesterol, and Diabetes.  Discusses basics of heart failure: signs/symptoms and treatments.  Introduces Heart Failure Zone chart for action plan for heart failure. Written material provided at class time.   Infection Prevention: - Provides verbal and written material to individual with discussion of infection control including proper hand washing and proper equipment cleaning during exercise session. Flowsheet Row Cardiac Rehab from 06/06/2023 in [facility] Cardiac and Pulmonary Rehab  Date 06/06/23  Educator [facility]  Instruction Review Code 1- Verbalizes Understanding    Falls Prevention: - Provides verbal and written material to individual with discussion of falls prevention and safety. Flowsheet Row Cardiac Rehab from 06/06/2023 in [facility] Cardiac and Pulmonary Rehab  Date 06/06/23  Educator [facility]  Instruction Review Code 1- Verbalizes Understanding    Other: -Provides group and verbal instruction on various topics (see comments)   Knowledge Questionnaire Score:  Knowledge Questionnaire Score - 09/19/23 1727       Knowledge Questionnaire Score   Pre Score 21/26    Post Score 23/26          Core  Components/Risk Factors/Patient Goals at Admission:  Personal Goals and Risk Factors at Admission - 06/06/23 1212       Core Components/Risk Factors/Patient Goals on Admission   Intervention Weight Management: Develop a combined nutrition and exercise program designed to reach desired caloric intake, while maintaining appropriate intake of nutrient and fiber, sodium and fats, and appropriate energy expenditure required for the weight goal.;Weight Management: Provide education and appropriate resources to help participant work on and attain dietary goals.;Weight Management/Obesity: Establish reasonable short term and long term weight goals.;Obesity: Provide education and appropriate resources to help participant work on and attain dietary goals.    Expected Outcomes Short Term: Continue to assess and modify interventions until short term weight is achieved;Weight Loss: Understanding of general recommendations for a balanced deficit meal plan, which promotes 1-2 lb weight loss per week and includes a negative energy balance of [phone] kcal/d;Understanding recommendations for meals to include 15-35% energy as protein, 25-35% energy from fat, 35-60% energy from carbohydrates, less than 200mg  of dietary cholesterol, 20-35 gm of total fiber daily;Understanding of distribution of calorie intake throughout the day with the consumption of 4-5 meals/snacks;Long Term: Adherence to nutrition and physical activity/exercise program aimed toward attainment of established weight goal    Tobacco Cessation Yes    Number of packs per day .2    Intervention Assist the participant in steps to quit. Provide individualized education and counseling about committing to Tobacco Cessation, relapse prevention, and pharmacological support that can be provided by physician.;Education officer, environmental, assist with locating and accessing local/national Quit Smoking programs, and support quit date choice.    Expected Outcomes Short  Term: Will demonstrate readiness to quit, by selecting a quit date.;Short Term: Will quit all tobacco product use, adhering to prevention of relapse plan.;Long Term: Complete abstinence from all tobacco products for at least 12 months from quit date.    Hypertension Yes    Intervention Provide education on lifestyle modifcations including regular physical activity/exercise, weight management, moderate sodium restriction and increased consumption of  fresh fruit, vegetables, and low fat dairy, alcohol moderation, and smoking cessation.;Monitor prescription use compliance.    Expected Outcomes Short Term: Continued assessment and intervention until BP is < 140/50mm HG in hypertensive participants. < 130/38mm HG in hypertensive participants with diabetes, heart failure or chronic kidney disease.;Long Term: Maintenance of blood pressure at goal levels.    Lipids Yes    Intervention Provide education and support for participant on nutrition & aerobic/resistive exercise along with prescribed medications to achieve LDL 70mg , HDL >40mg .    Expected Outcomes Short Term: Participant states understanding of desired cholesterol values and is compliant with medications prescribed. Participant is following exercise prescription and nutrition guidelines.;Long Term: Cholesterol controlled with medications as prescribed, with individualized exercise RX and with personalized nutrition plan. Value goals: LDL < 70mg , HDL > 40 mg.          Education:Diabetes - Individual verbal and written instruction to review signs/symptoms of diabetes, desired ranges of glucose level fasting, after meals and with exercise. Acknowledge that pre and post exercise glucose checks will be done for 3 sessions at entry of program.   Core Components/Risk Factors/Patient Goals Review:   Goals and Risk Factor Review     Row Name 06/06/23 1213 07/25/23 1745 08/13/23 1009 10/01/23 1739       Core Components/Risk Factors/Patient Goals Review    Personal Goals Review Tobacco Cessation Tobacco Cessation;Weight Management/Obesity;Lipids;Hypertension Tobacco Cessation;Hypertension Tobacco Cessation    Review Zen Cedillos is a current tobacco user. Intervention for tobacco cessation was provided at the initial medical review. He} was asked about readiness to quit and reported he has cut backto3-4 cigarettes a day and does not have a quit date yet but is planning to continue to cut back and eventually quit . Patient was advised and educated about tobacco cessation using combination therapy, tobacco cessation classes, quit line, and quit smoking apps. Patient demonstrated understanding of this material. Staff will continue to provide encouragement and follow up with the patient throughout the program Joe reports that he has gotten down to 2 cigarettes today, and is currently using the patch, which has been very helpful in reducing the number of cigarettes. He is thinking about picking a quit date but does not have one yet. He has been successfully working towards his weight loss goal. He stated he is down to 222 lbs from 233 lbs. He continues to work towards a weight goal of 205-210 lbs. He takes all his BP and cholesterol meds and follows up regularly with doctor for all required lab work. He does have a BP cuff at home and checks it over the weekend when he is not coming to rehab. He was encouraged to start getting into the habit of checking it consistently at home. Joe reports he has been consistently smoking 4 cigarettes daily. He is okay with the progress he has made to reduce his cig intake, but feels stuck. We discussed resources to help with quiting completely, and he states he knows it is up to him to officially quit and he isn't totally ready just yet. He has been checking his blood pressure at home with his wife and it has been consistent. He plans to continue taking his blood pressure regularly. He recently had his check up with his primary and  got very good results with his labs. Joe wants to quit smoking and after his vacation coming up he is going to try to quit [place]. He wants to do it and states he has  the will. He has been consistant with exercise and will be consistant with quitting tobacco.    Expected Outcomes Short: continue to cut back and pick a quit  date. Long: become tobacco free Short: pick a quit date for smoking cessation. 215 lbs short term weight goal. Start chekcing BP at home more consistently. Long: quit smoking, continue to control cardiac risk factors. Weight goal of 210 lbs. Short: attend cardiac rehab for education and review smoking cessation resources. Long: manage risk factors independently. Short: reduce tobacco intake. Long: quit smoking.       Core Components/Risk Factors/Patient Goals at Discharge (Final Review):   Goals and Risk Factor Review - 10/01/23 1739       Core Components/Risk Factors/Patient Goals Review   Personal Goals Review Tobacco Cessation    Review Joe wants to quit smoking and after his vacation coming up he is going to try to quit [place]. He wants to do it and states he has the will. He has been consistant with exercise and will be consistant with quitting tobacco.    Expected Outcomes Short: reduce tobacco intake. Long: quit smoking.          ITP Comments:  ITP Comments     Row Name 05/30/23 1343 06/06/23 1200 06/11/23 1732 06/13/23 1137 07/11/23 1316   ITP Comments Virtual Visit completed. Patient informed on EP and RD appointment and 6 Minute walk test. Patient also informed of patient health questionnaires on My Chart. Patient Verbalizes understanding. Visit diagnosis can be found in [facility] 05/23/2023. Completed and gym orientation. Initial ITP created and sent for review to Dr. Oneil Pinal, Medical Director. Lebaron Bautch is a current tobacco user. Intervention for tobacco cessation was provided at the initial medical review. He} was asked about readiness to quit  and reported he has cut backto3-4 cigarettes a day and does not have a quit date yet but is planning to continue to cut back and eventually quit . Patient was advised and educated about tobacco cessation using combination therapy, tobacco cessation classes, quit line, and quit smoking apps. Patient demonstrated understanding of this material. Staff will continue to provide encouragement and follow up with the patient throughout the program. First full day of exercise!  Patient was oriented to gym and equipment including functions, settings, policies, and procedures.  Patient's individual exercise prescription and treatment plan were reviewed.  All starting workloads were established based on the results of the 6 minute walk test done at initial orientation visit.  The plan for exercise progression was also introduced and progression will be customized based on patient's performance and goals. 30 Day review completed. Medical Director ITP review done, changes made as directed, and signed approval by Medical Director.   new to prgram 30 Day review completed. Medical Director ITP review done, changes made as directed, and signed approval by Medical Director.    Row Name 08/08/23 1110 09/05/23 1117 10/03/23 1015 10/15/23 1732     ITP Comments 30 Day review completed. Medical Director ITP review done, changes made as directed, and signed approval by Medical Director. 30 Day review completed. Medical Director ITP review done, changes made as directed, and signed approval by Medical Director. 30 Day review completed. Medical Director ITP review done, changes made as directed, and signed approval by Medical Director. oseph graduated today from  rehab with 36 sessions completed.  Details of the patient's exercise prescription and what He needs to do in order to continue the prescription and progress were  discussed with patient.  Patient was given a copy of prescription and goals.  Patient verbalized understanding. Keanon  plans to continue to exercise by joining the [name].       Comments: Discharge ITP

## 2023-10-15 NOTE — Progress Notes (Signed)
 Daily Session Note  Patient Details  Name: Gerald Garza MRN: [id number] Date of Birth: [birth year] Referring Provider:   Flowsheet Row Cardiac Rehab from 06/06/2023 in [facility] Cardiac [facility] Pul[facility] Rehab  Referring Provider Dr. Alm Clay    Encounter Date: 10/15/2023  Check In:  Session Check In - 10/15/23 1719       Check-In   Supervising physician immediately available to respond to emergencies See telemetry face sheet for immediately available ER MD    Location [facility]-Cardiac & Pulmonary Rehab    Staff Present Gerald Rodney RN,BSN;Gerald Garza [facility] BS, ACSM CEP, Exercise Physiologist    Virtual Visit No    Medication changes reported     No    Fall or balance concerns reported    No    Tobacco Cessation No Change    Warm-up and Cool-down Performed on first and last piece of equipment    Resistance Training Performed Yes    VAD Patient? No    PAD/SET Patient? No      Pain Assessment   Currently in Pain? No/denies             Social History   Tobacco Use  Smoking Status Every Day   Current packs/day: 0.50   Average packs/day: 0.5 packs/day for 40.0 years (20.0 ttl pk-yrs)   Types: Cigarettes  Smokeless Tobacco Never  Tobacco Comments   Smokes 4 cigarettes daily     Goals Met:  Independence with exercise equipment Exercise tolerated well No report of concerns or symptoms today Strength training completed today  Goals Unmet:  Not Applicable  Comments:  Gerald Garza graduated today from  rehab with 36 sessions completed.  Details of the patient's exercise prescription and what He needs to do in order to continue the prescription and progress were discussed with patient.  Patient was given a copy of prescription and goals.  Patient verbalized understanding. Gerald Garza plans to continue to exercise by joining the [name].    Dr. Oneil Garza is Medical Director for [facility] Cardiac Rehabilitation.  Dr. Fuad Garza is Medical Director for [facility]  Pulmonary Rehabilitation.

## 2023-10-15 NOTE — Progress Notes (Signed)
 Discharge Summary   Gerald Garza  1960-10-26   Gerald Garza graduated today from  rehab with 36 sessions completed.  Details of the patient's exercise prescription and what He needs to do in order to continue the prescription and progress were discussed with patient.  Patient was given a copy of prescription and goals.  Patient verbalized understanding. [name] plans to continue to exercise by joining the [name].   6 Minute Walk     Row Name 06/06/23 1202 09/17/23 1727       6 Minute Walk   Phase Initial Discharge    Distance 1160 feet 1400 feet    Distance % Change -- 21 %    Distance Feet Change -- 240 ft    Walk Time 6 minutes 6 minutes    # of Rest Breaks 0 0    MPH 2.2 2.65    METS 2.76 3.37    RPE 7 11    Perceived Dyspnea  0 0    VO2 Peak 9.66 11.81    Symptoms No No    Resting HR 73 bpm 75 bpm    Resting BP 130/80 140/78    Resting Oxygen Saturation  96 % 95 %    Exercise Oxygen Saturation  during 6 min walk 96 % 96 %    Max Ex. HR 89 bpm 96 bpm    Max Ex. BP 150/80 160/76    2 Minute Post BP 124/80 --

## 2023-11-01 ENCOUNTER — Ambulatory Visit: Attending: Nurse Practitioner

## 2023-11-01 DIAGNOSIS — I739 Peripheral vascular disease, unspecified: Secondary | ICD-10-CM

## 2023-11-04 LAB — VAS US ABI WITH/WO TBI
Left ABI: 0.73
Right ABI: 0.9

## 2023-11-05 ENCOUNTER — Ambulatory Visit: Payer: Self-pay | Admitting: Physician Assistant

## 2023-11-05 DIAGNOSIS — I739 Peripheral vascular disease, unspecified: Secondary | ICD-10-CM

## 2023-11-07 ENCOUNTER — Other Ambulatory Visit: Payer: Self-pay | Admitting: *Deleted

## 2023-11-07 DIAGNOSIS — I739 Peripheral vascular disease, unspecified: Secondary | ICD-10-CM

## 2023-11-20 ENCOUNTER — Ambulatory Visit: Attending: Cardiovascular Disease | Admitting: Cardiovascular Disease

## 2023-11-20 ENCOUNTER — Encounter: Payer: Self-pay | Admitting: Cardiovascular Disease

## 2023-11-20 VITALS — BP 100/64 | HR 90 | Ht 72.0 in | Wt 225.5 lb

## 2023-11-20 DIAGNOSIS — I739 Peripheral vascular disease, unspecified: Secondary | ICD-10-CM

## 2023-11-20 DIAGNOSIS — Z136 Encounter for screening for cardiovascular disorders: Secondary | ICD-10-CM | POA: Diagnosis not present

## 2023-11-20 DIAGNOSIS — I10 Essential (primary) hypertension: Secondary | ICD-10-CM

## 2023-11-20 DIAGNOSIS — Z720 Tobacco use: Secondary | ICD-10-CM

## 2023-11-20 DIAGNOSIS — I2510 Atherosclerotic heart disease of native coronary artery without angina pectoris: Secondary | ICD-10-CM

## 2023-11-20 NOTE — Progress Notes (Signed)
 Cardiology Office Note   Date:  11/20/2023   ID:  Gerald Garza, DOB [birth year], MRN [id number]  PCP:  Signa Rush, MD (Inactive)  Cardiologist:   Dr. Perla  Chief Complaint  Patient presents with   Follow-up    F/u testing. Meds reviewed verbally with pt.      History of Present Illness: Gerald Garza is a 63 y.o. male who was referred by Gerald Garza for evaluation management of peripheral arterial disease. He has known history of coronary artery disease with non-STEMI and drug-eluting stent placement to the LAD in March 2025, essential hypertension, hyperlipidemia, tobacco use and osteoarthritis.  He participated in cardiac rehab and noted bilateral calf claudication with exertion.  It was worse on the left than the right.  He underwent lower extremity arterial Doppler in August which showed an ABI of 0.91 on the right and 0.73 on the left.  Lower extremity claudication improved gradually with exercise and he is now able to walk 800 to 8000 feet before having symptoms.  He has no rest pain or lower extremity ulceration.  No chest pain or shortness of breath.  He takes his medications regularly.  He is determined to quit smoking and made significant progress.   Past Medical History:  Diagnosis Date   Arthritis    CAD (coronary artery disease)    a. 05/2023 NSTEMI-->Myoview : high risk w/ septal/apical infarct/ischemia; b. 05/2023 Cath/PCI: LM nl, LAD 180m/d (2.5x22 Xience Frontier DES x 2), D2 50ost, LCX min irregs, RCA 20/20p-->12 mos DAPT followed by SAPT rec.   Diastolic dysfunction    a. 05/2023 Echo: EF 55-60%, no rwma, GrI DD, nl RV fxn, RVSP 22.1 mmHg.   Headache    hx cluster headaches   Hyperlipidemia LDL goal <70    Hypertension    Pneumonia 2015   bacterial    Past Surgical History:  Procedure Laterality Date   bunion surgery Left    right hammer to done also   COLONOSCOPY WITH PROPOFOL  N/A 10/12/2015   Procedure: COLONOSCOPY WITH PROPOFOL ;  Surgeon:  Gladis MARLA Louder, MD;  Location: [facility] ENDOS[facility];  Service: Endoscopy;  Laterality: N/A;   CORONARY STENT INTERVENTION N/A 05/24/2023   Procedure: CORONARY STENT INTERVENTION;  Surgeon: Anner Alm ORN, MD;  Location: [facility] INVASIVE CV LAB;  Service: Cardiovascular;  Laterality: N/A;   LEFT HEART CATH AND CORONARY ANGIOGRAPHY N/A 05/24/2023   Procedure: LEFT HEART CATH AND CORONARY ANGIOGRAPHY;  Surgeon: Anner Alm ORN, MD;  Location: [facility] INVASIVE CV LAB;  Service: Cardiovascular;  Laterality: N/A;   REPLACEMENT TOTAL KNEE Bilateral    TONSILLECTOMY  age 49     Current Outpatient Medications  Medication Sig Dispense Refill   acetaminophen  (TYLENOL ) 500 MG tablet Take 500 mg by mouth every 6 (six) hours as needed.     amLODipine  (NORVASC ) 5 MG tablet Take 1 tablet (5 mg total) by mouth daily. 90 tablet 3   aspirin  EC 81 MG tablet Take 1 tablet (81 mg total) by mouth daily. Swallow whole. 30 tablet 12   fish oil-omega-3 fatty acids 1000 MG capsule Take 2 g by mouth daily.     lisinopril -hydrochlorothiazide  (PRINZIDE ,ZESTORETIC ) 20-25 MG tablet Take 1 tablet by mouth daily.     Magnesium 250 MG TABS Take by mouth 2 (two) times daily.     metoprolol  succinate (TOPROL -XL) 25 MG 24 hr tablet Take 0.5 tablets (12.5 mg total) by mouth daily. 30 tablet 3   Multiple Vitamin (MULTI-VITAMIN DAILY PO)  Take by mouth.     naproxen sodium (ALEVE) 220 MG tablet Take 220 mg by mouth daily as needed.     prasugrel  (EFFIENT ) 10 MG TABS tablet Take 1 tablet (10 mg total) by mouth daily. 90 tablet 3   rosuvastatin  (CRESTOR ) 20 MG tablet Take 1 tablet (20 mg total) by mouth at bedtime. 90 tablet 3   vitamin B-12 (CYANOCOBALAMIN) 250 MCG tablet Take 250 mcg by mouth daily.     No current facility-administered medications for this visit.    Allergies:   Codeine and Hydrocodone    Social History:  The patient  reports that he has been smoking cigarettes. He has a 20 pack-year smoking history. He has never used  smokeless tobacco. He reports current alcohol use. He reports that he does not use drugs.   Family History:  The patient's family history includes Drug abuse in his brother; Heart attack in his brother; Throat cancer in his brother.    ROS:  Please see the history of present illness.   Otherwise, review of systems are positive for none.   All other systems are reviewed and negative.    PHYSICAL EXAM: VS:  BP 100/64 (BP Location: Left Arm, Patient Position: Sitting, Cuff Size: Large)   Pulse 90   Ht 6' (1.829 m)   Wt 225 lb 8 oz (102.3 kg)   SpO2 97%   BMI 30.58 kg/m  , BMI Body mass index is 30.58 kg/m. GEN: Well nourished, well developed, in no acute distress  HEENT: normal  Neck: no JVD, carotid bruits, or masses Cardiac: RRR; no murmurs, rubs, or gallops,no edema  Respiratory:  clear to auscultation bilaterally, normal work of breathing GI: soft, nontender, nondistended, + BS MS: no deformity or atrophy  Skin: warm and dry, no rash Neuro:  Strength and sensation are intact Psych: euthymic mood, full affect Distal pulses are not palpable  EKG:  EKG is not ordered today.    Recent Labs: 05/23/2023: TSH 1.022 05/25/2023: BUN 19; Creatinine, Ser 0.82; Hemoglobin 14.9; Platelets 176; Potassium 3.5; Sodium 140 07/02/2023: ALT 38    Lipid Panel    Component Value Date/Time   CHOL 123 07/02/2023 1131   TRIG 95 07/02/2023 1131   HDL 47 07/02/2023 1131   CHOLHDL 2.6 07/02/2023 1131   CHOLHDL 4.9 05/23/2023 1017   VLDL 74 (H) 05/23/2023 1017   LDLCALC 58 07/02/2023 1131      Wt Readings from Last 3 Encounters:  11/20/23 225 lb 8 oz (102.3 kg)  10/11/23 225 lb 6 oz (102.2 kg)  09/17/23 223 lb (101.2 kg)           No data to display            ASSESSMENT AND PLAN:  1.  Peripheral arterial disease: Mild bilateral leg claudication does not seem to be lifestyle limiting at this point.  I discussed with him the natural history and management of claudication.  His  risk factors are well-managed.  Given that his symptoms are not lifestyle limiting, we will continue with medical therapy for now.  I discussed with him the importance of a daily exercise program.  2.  Coronary artery disease involving native coronary arteries without angina: Continue medical therapy  3.  Essential hypertension: Blood pressure is well-controlled on current medications.  4.  Hyperlipidemia: Most recent lipid profile showed an LDL of 58.  Continue rosuvastatin .  5.  Tobacco use: He is determined to quit smoking and made significant progress.  6.  Screening for abdominal aortic aneurysm: Considering his age and smoking status.  I requested abdominal aortic ultrasound.    Disposition:   FU with me in 6 months  Signed,  Deatrice Cage, MD  11/20/2023 2:19 PM    [facility]

## 2023-11-20 NOTE — Patient Instructions (Signed)
 Medication Instructions:  Your physician recommends that you continue on your current medications as directed. Please refer to the Current Medication list given to you today.   *If you need a refill on your cardiac medications before your next appointment, please call your pharmacy*  Lab Work: No labs ordered today  If you have labs (blood work) drawn today and your tests are completely normal, you will receive your results only by: MyChart Message (if you have MyChart) OR A paper copy in the mail If you have any lab test that is abnormal or we need to change your treatment, we will call you to review the results.  Testing/Procedures: Your physician has requested that you have an abdominal aorta duplex. During this test, an ultrasound is used to evaluate the aorta. Allow 30 minutes for this exam. Do not eat after midnight the day before and avoid carbonated beverages. This will take place at [address], [place] [address]   Follow-Up: At [facility], you and your health needs are our priority.  As part of our continuing mission to provide you with exceptional heart care, our providers are all part of one team.  This team includes your primary Cardiologist (physician) and Advanced Practice Providers or APPs (Physician Assistants and Nurse Practitioners) who all work together to provide you with the care you need, when you need it.  Your next appointment:   6 month(s)  Provider:   You may see Timothy Gollan, MD or one of the following Advanced Practice Providers on your designated Care Team:   Lonni Meager, NP Lesley Maffucci, PA-C Bernardino Bring, PA-C Cadence Stebbins, PA-C Tylene Lunch, NP Barnie Hila, NP    We recommend signing up for the patient portal called MyChart.  Sign up information is provided on this After Visit Summary.  MyChart is used to connect with patients for Virtual Visits (Telemedicine).  Patients are able to view  lab/test results, encounter notes, upcoming appointments, etc.  Non-urgent messages can be sent to your provider as well.   To learn more about what you can do with MyChart, go to [email or web address].

## 2023-11-30 ENCOUNTER — Ambulatory Visit (HOSPITAL_COMMUNITY)
Admission: RE | Admit: 2023-11-30 | Discharge: 2023-11-30 | Disposition: A | Discharge status: Home / Self Care | Source: Ambulatory Visit | Attending: Cardiovascular Disease | Admitting: Cardiovascular Disease

## 2023-11-30 DIAGNOSIS — Z720 Tobacco use: Secondary | ICD-10-CM | POA: Diagnosis not present

## 2023-11-30 DIAGNOSIS — I10 Essential (primary) hypertension: Secondary | ICD-10-CM | POA: Insufficient documentation

## 2023-11-30 DIAGNOSIS — Z136 Encounter for screening for cardiovascular disorders: Secondary | ICD-10-CM | POA: Diagnosis not present

## 2023-12-07 ENCOUNTER — Ambulatory Visit: Payer: Self-pay | Admitting: Cardiovascular Disease

## 2024-02-02 ENCOUNTER — Other Ambulatory Visit: Payer: Self-pay | Admitting: Nurse Practitioner

## 2024-02-02 DIAGNOSIS — I10 Essential (primary) hypertension: Secondary | ICD-10-CM
